# Patient Record
Sex: Female | Born: 1975 | Race: White | Hispanic: No | Marital: Married | Smoking: Never smoker
Health system: Southern US, Community
[De-identification: ages and names within clinical notes are randomized; demographics above are authoritative.]

## PROBLEM LIST (undated history)

## (undated) DIAGNOSIS — R7303 Prediabetes: Secondary | ICD-10-CM

## (undated) DIAGNOSIS — E282 Polycystic ovarian syndrome: Secondary | ICD-10-CM

## (undated) HISTORY — DX: Prediabetes: R73.03

## (undated) HISTORY — DX: Polycystic ovarian syndrome: E28.2

## (undated) HISTORY — PX: CERVICAL BIOPSY  W/ LOOP ELECTRODE EXCISION: SUR135

---

## 1998-05-17 ENCOUNTER — Other Ambulatory Visit: Admission: RE | Admit: 1998-05-17 | Discharge: 1998-05-17 | Payer: Self-pay | Admitting: Obstetrics and Gynecology

## 1998-06-28 ENCOUNTER — Other Ambulatory Visit: Admission: RE | Admit: 1998-06-28 | Discharge: 1998-06-28 | Payer: Self-pay | Admitting: Obstetrics and Gynecology

## 1999-04-17 ENCOUNTER — Ambulatory Visit (HOSPITAL_COMMUNITY): Admission: RE | Admit: 1999-04-17 | Discharge: 1999-04-17 | Payer: Self-pay | Admitting: Obstetrics and Gynecology

## 1999-04-17 ENCOUNTER — Encounter: Payer: Self-pay | Admitting: Obstetrics and Gynecology

## 1999-06-13 ENCOUNTER — Other Ambulatory Visit: Admission: RE | Admit: 1999-06-13 | Discharge: 1999-06-13 | Payer: Self-pay | Admitting: Gynecology

## 1999-08-28 ENCOUNTER — Encounter: Payer: Self-pay | Admitting: Gynecology

## 1999-08-28 ENCOUNTER — Ambulatory Visit (HOSPITAL_COMMUNITY): Admission: RE | Admit: 1999-08-28 | Discharge: 1999-08-28 | Payer: Self-pay | Admitting: Gynecology

## 1999-09-26 ENCOUNTER — Ambulatory Visit (HOSPITAL_COMMUNITY): Admission: RE | Admit: 1999-09-26 | Discharge: 1999-09-26 | Payer: Self-pay | Admitting: Gynecology

## 1999-09-26 ENCOUNTER — Encounter: Payer: Self-pay | Admitting: Gynecology

## 1999-10-08 ENCOUNTER — Encounter: Admission: RE | Admit: 1999-10-08 | Discharge: 2000-01-06 | Payer: Self-pay | Admitting: Gynecology

## 2000-01-16 ENCOUNTER — Encounter (INDEPENDENT_AMBULATORY_CARE_PROVIDER_SITE_OTHER): Payer: Self-pay

## 2000-01-16 ENCOUNTER — Inpatient Hospital Stay (HOSPITAL_COMMUNITY): Admission: AD | Admit: 2000-01-16 | Discharge: 2000-01-19 | Payer: Self-pay | Admitting: Gynecology

## 2000-03-02 ENCOUNTER — Other Ambulatory Visit: Admission: RE | Admit: 2000-03-02 | Discharge: 2000-03-02 | Payer: Self-pay | Admitting: Gynecology

## 2001-03-12 ENCOUNTER — Other Ambulatory Visit: Admission: RE | Admit: 2001-03-12 | Discharge: 2001-03-12 | Payer: Self-pay | Admitting: Gynecology

## 2001-12-08 ENCOUNTER — Other Ambulatory Visit: Admission: RE | Admit: 2001-12-08 | Discharge: 2001-12-08 | Payer: Self-pay | Admitting: Gynecology

## 2001-12-16 ENCOUNTER — Encounter: Admission: RE | Admit: 2001-12-16 | Discharge: 2002-03-16 | Payer: Self-pay | Admitting: Gynecology

## 2002-02-10 ENCOUNTER — Encounter: Admission: RE | Admit: 2002-02-10 | Discharge: 2002-05-11 | Payer: Self-pay | Admitting: *Deleted

## 2002-02-24 ENCOUNTER — Ambulatory Visit (HOSPITAL_COMMUNITY): Admission: RE | Admit: 2002-02-24 | Discharge: 2002-02-24 | Payer: Self-pay | Admitting: Gynecology

## 2002-02-24 ENCOUNTER — Encounter: Payer: Self-pay | Admitting: Gynecology

## 2002-03-01 ENCOUNTER — Other Ambulatory Visit: Admission: RE | Admit: 2002-03-01 | Discharge: 2002-03-01 | Payer: Self-pay | Admitting: Gynecology

## 2002-06-29 ENCOUNTER — Encounter: Payer: Self-pay | Admitting: Gynecology

## 2002-06-29 ENCOUNTER — Ambulatory Visit (HOSPITAL_COMMUNITY): Admission: RE | Admit: 2002-06-29 | Discharge: 2002-06-29 | Payer: Self-pay | Admitting: Gynecology

## 2002-06-30 ENCOUNTER — Inpatient Hospital Stay (HOSPITAL_COMMUNITY): Admission: AD | Admit: 2002-06-30 | Discharge: 2002-07-03 | Payer: Self-pay | Admitting: Gynecology

## 2002-08-09 ENCOUNTER — Other Ambulatory Visit: Admission: RE | Admit: 2002-08-09 | Discharge: 2002-08-09 | Payer: Self-pay | Admitting: Gynecology

## 2003-08-29 ENCOUNTER — Other Ambulatory Visit: Admission: RE | Admit: 2003-08-29 | Discharge: 2003-08-29 | Payer: Self-pay | Admitting: Gynecology

## 2004-09-23 ENCOUNTER — Other Ambulatory Visit: Admission: RE | Admit: 2004-09-23 | Discharge: 2004-09-23 | Payer: Self-pay | Admitting: Gynecology

## 2005-09-25 ENCOUNTER — Other Ambulatory Visit: Admission: RE | Admit: 2005-09-25 | Discharge: 2005-09-25 | Payer: Self-pay | Admitting: Gynecology

## 2006-10-01 ENCOUNTER — Other Ambulatory Visit: Admission: RE | Admit: 2006-10-01 | Discharge: 2006-10-01 | Payer: Self-pay | Admitting: Gynecology

## 2007-01-01 ENCOUNTER — Other Ambulatory Visit: Admission: RE | Admit: 2007-01-01 | Discharge: 2007-01-01 | Payer: Self-pay | Admitting: Gynecology

## 2007-02-12 ENCOUNTER — Ambulatory Visit (HOSPITAL_COMMUNITY): Admission: RE | Admit: 2007-02-12 | Discharge: 2007-02-12 | Payer: Self-pay | Admitting: Endocrinology

## 2007-11-10 ENCOUNTER — Other Ambulatory Visit: Admission: RE | Admit: 2007-11-10 | Discharge: 2007-11-10 | Payer: Self-pay | Admitting: Gynecology

## 2008-06-20 IMAGING — US US TRANSVAGINAL NON-OB
1 series · 13 of 25 positions shown · non-contrast
Comparison: none

02/15/07 – DUPLICATE COPY for exam association in [facility] – No change from original report.
CLINICAL DATA: Polycystic ovarian syndrome.  Evaluate for ovarian cyst.  
 TRANSABDOMINAL AND TRANSVAGINAL PELVIC ULTRASOUND:
TECHNIQUE: Both transabdominal and transvaginal ultrasound examinations of the pelvis were performed including evaluation of the uterus, ovaries, adnexal regions, and pelvic cul-de-sac.

[Series 1: us transvaginal non-ob · 13 of 38 slices shown]
[im 1/38]
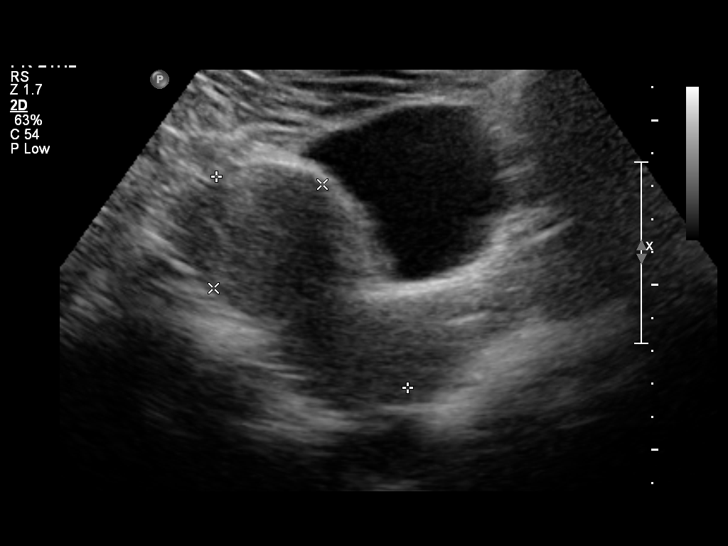
[im 4/38]
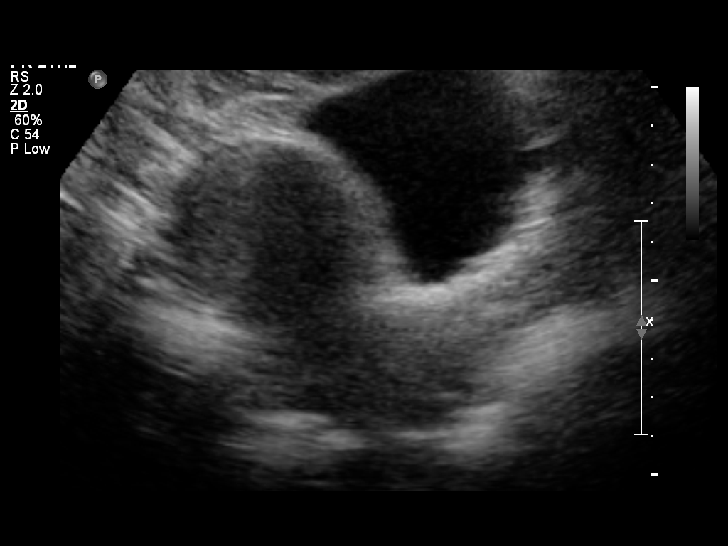
[im 7/38]
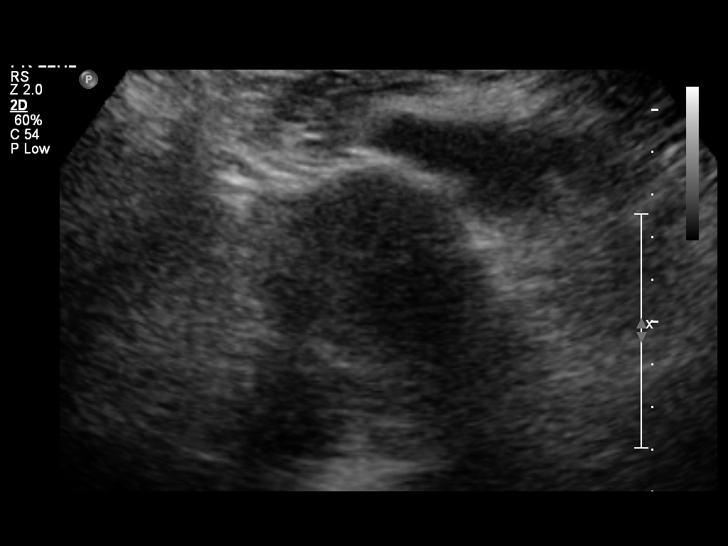
[im 10/38]
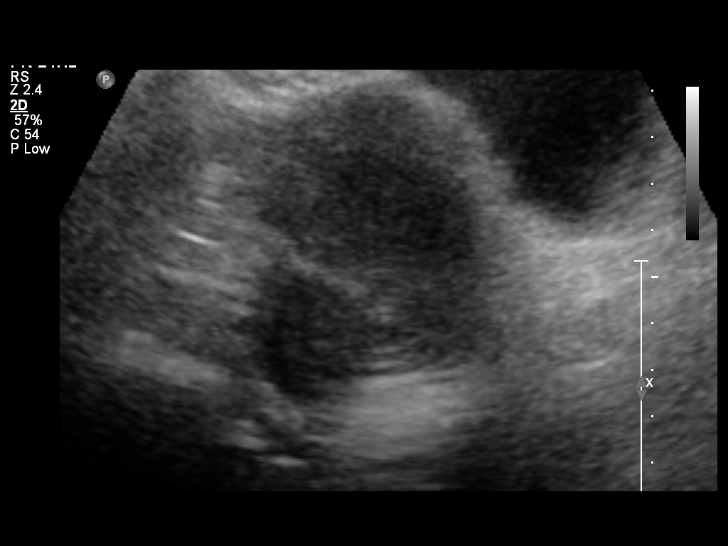
[im 13/38]
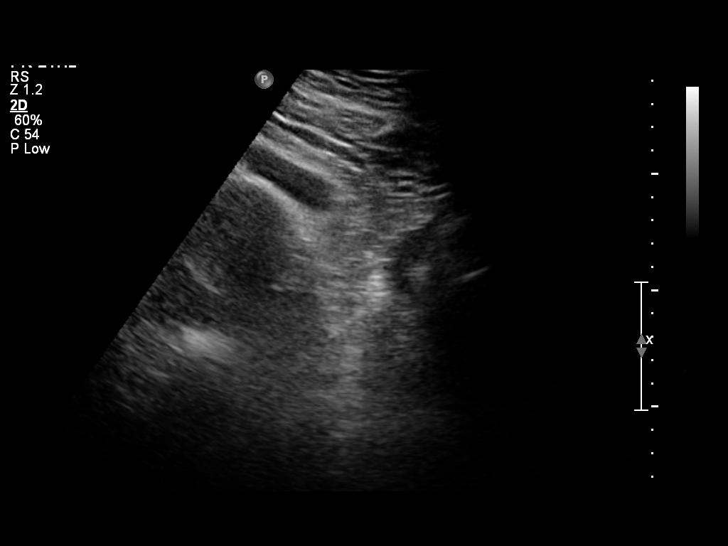
[im 16/38]
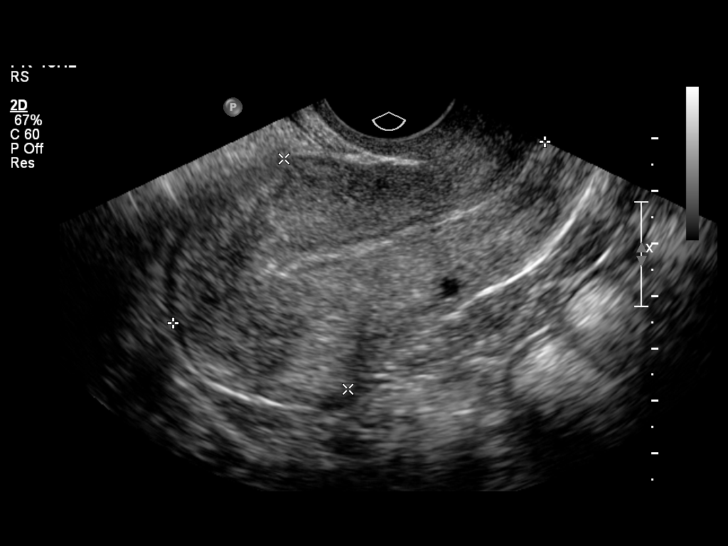
[im 19/38]
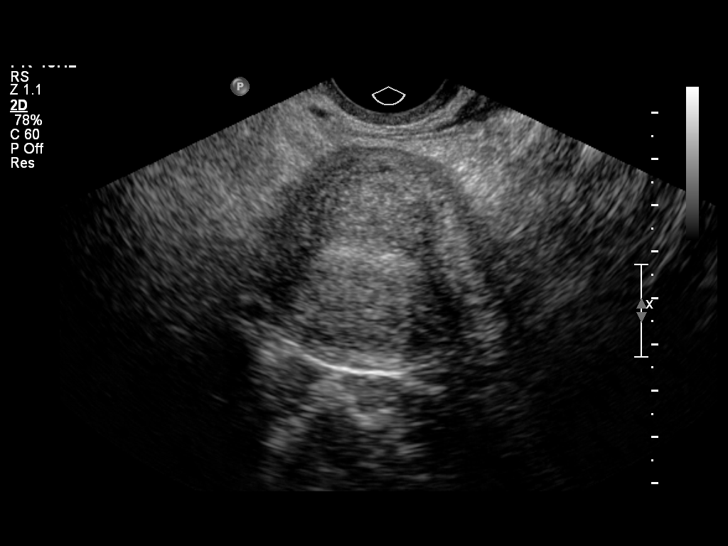
[im 22/38]
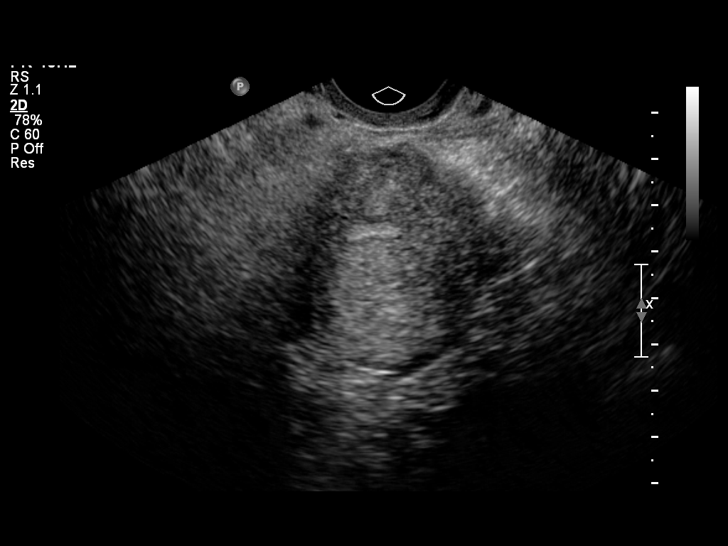
[im 25/38]
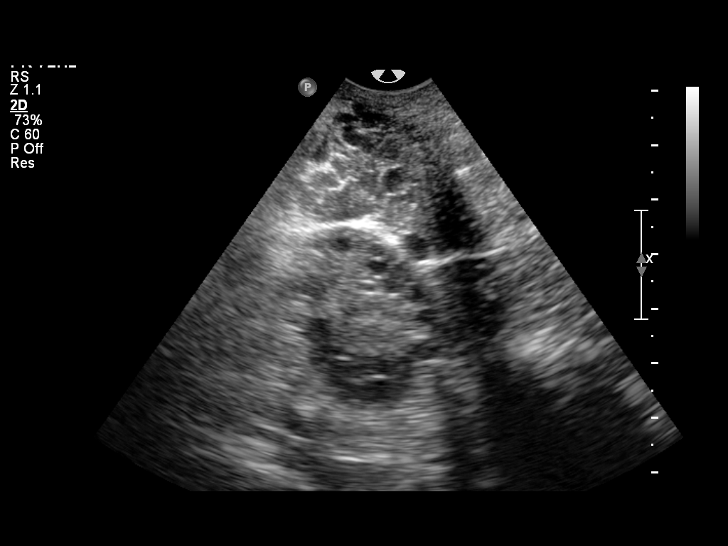
[im 28/38]
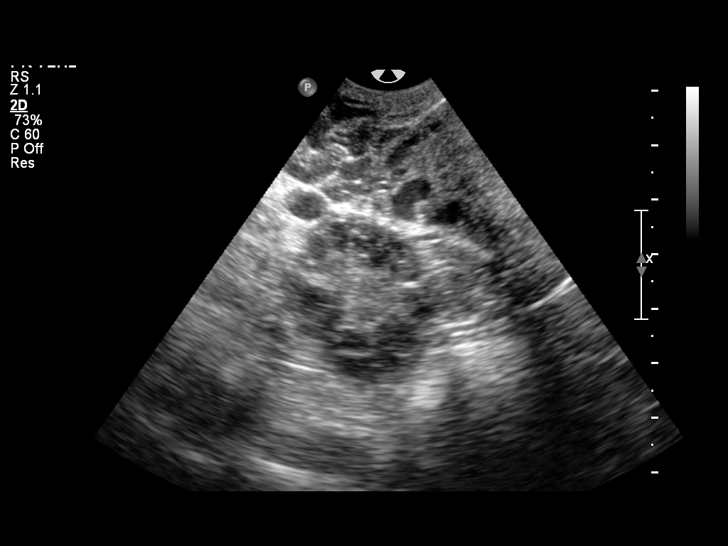
[im 31/38]
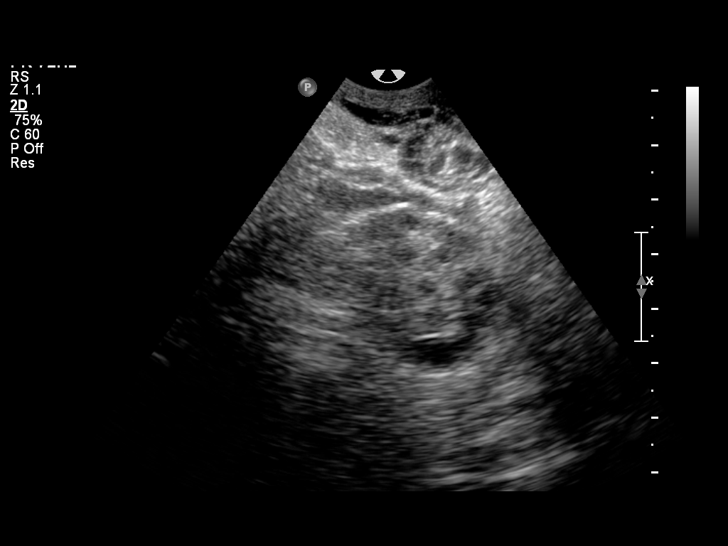
[im 34/38]
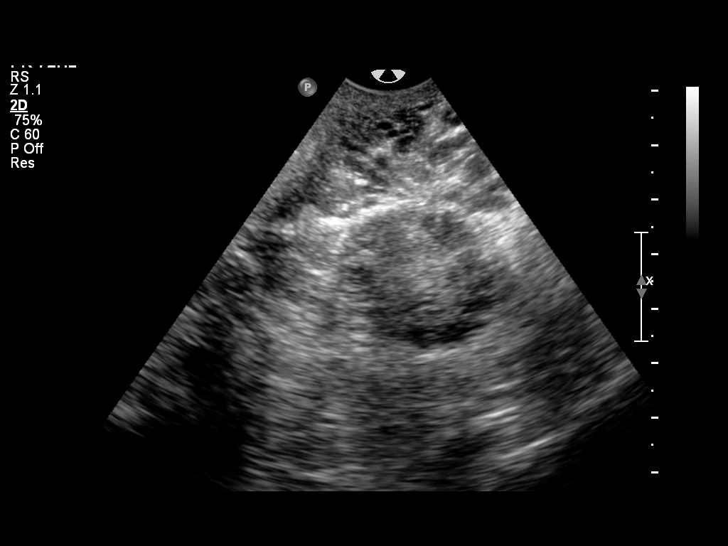
[im 38/38]
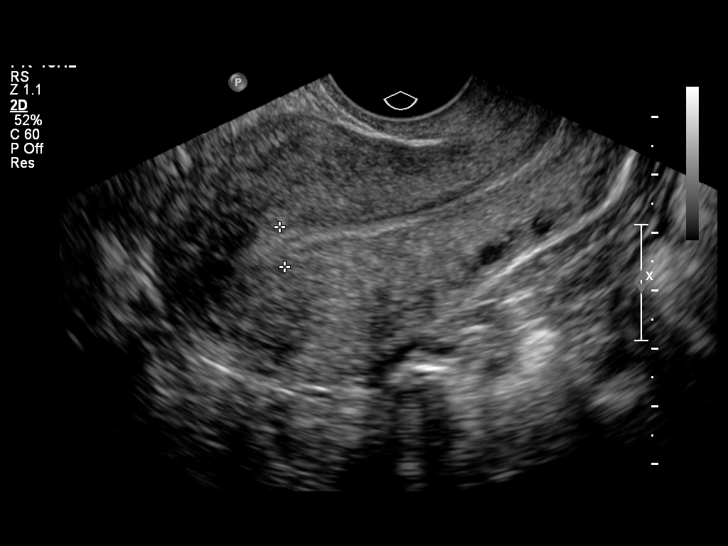

[13 of 25 positions shown; findings below may reference images not displayed]

FINDINGS: The uterus is normal in size and appearance.  No fibroids or other uterine abnormalities are seen.  Endometrial thickness measures approximately 7 mm in transvaginal sonography.  
 The right ovary measures 3.3 x 2.4 x 2.3 cm and the left ovary measures 2.9 x 2.3 x 2.6 cm.  Both ovaries have numerous tiny peripherally located follicles measuring less than 1 cm.  These findings are consistent with a diagnosis of polycystic ovarian syndrome.  No dominant ovarian follicles or cysts are noted.  No adnexal masses or free fluid are identified by transabdominal or transvaginal sonography.
IMPRESSION: 1.  Numerous, tiny less than 1 cm follicles in both ovaries, without evidence of a dominant follicle or cyst.  These findings are consistent with polycystic ovarian syndrome. 
 2.  Normal uterus.  
 3.  No evidence of pelvic or adnexal mass.

## 2008-11-14 ENCOUNTER — Ambulatory Visit: Payer: Self-pay | Admitting: Gynecology

## 2008-11-14 ENCOUNTER — Other Ambulatory Visit: Admission: RE | Admit: 2008-11-14 | Discharge: 2008-11-14 | Payer: Self-pay | Admitting: Gynecology

## 2008-11-14 ENCOUNTER — Encounter: Payer: Self-pay | Admitting: Gynecology

## 2009-06-12 ENCOUNTER — Ambulatory Visit: Payer: Self-pay | Admitting: Gynecology

## 2009-06-12 ENCOUNTER — Other Ambulatory Visit: Admission: RE | Admit: 2009-06-12 | Discharge: 2009-06-12 | Payer: Self-pay | Admitting: Gynecology

## 2010-03-08 ENCOUNTER — Ambulatory Visit: Payer: Self-pay | Admitting: Gynecology

## 2010-03-15 ENCOUNTER — Other Ambulatory Visit
Admission: RE | Admit: 2010-03-15 | Discharge: 2010-03-15 | Payer: Self-pay | Source: Home / Self Care | Admitting: Gynecology

## 2010-04-12 ENCOUNTER — Ambulatory Visit
Admission: RE | Admit: 2010-04-12 | Discharge: 2010-04-12 | Payer: Self-pay | Source: Home / Self Care | Attending: Gynecology | Admitting: Gynecology

## 2010-04-15 ENCOUNTER — Ambulatory Visit
Admission: RE | Admit: 2010-04-15 | Discharge: 2010-04-15 | Payer: Self-pay | Source: Home / Self Care | Attending: Gynecology | Admitting: Gynecology

## 2010-08-09 NOTE — H&P (Signed)
NAME:  Monica Richmond, Monica Richmond                         ACCOUNT NO.:  [id number]   MEDICAL RECORD NO.:  [id number]                   PATIENT TYPE:  INP   LOCATION:  9160                                 FACILITY:  [facility]   PHYSICIAN:  Ivor Costa. Farrel Gobble, M.D.              DATE OF BIRTH:  1975-12-27   DATE OF ADMISSION:  06/30/2002  DATE OF DISCHARGE:                                HISTORY & PHYSICAL   CHIEF COMPLAINT:  37 plus week pregnancy for induction.   HISTORY OF PRESENT ILLNESS:  The patient is a 35 year old Gravida II, Para I  with a last menstrual period of October 08, 2001. Estimated date of confinement  of July 17, 2002. Estimated gestational age of 22 and 3/7th weeks with  history of insulin requiring diabetes mellitus in this pregnancy and  estimated fetal weight ten days ago of 7 pounds and 2 ounces for elective  induction. Her pregnancy has been complicated by insulin requiring diabetes  mellitus. Her estimated fetal weight at 28 weeks was greater than the 97th  percentile. However, with improvement of glycemic control, her follow-up  estimated fetal weight have improved. At 32 weeks she was in the 90th to  95th percentile and at 35 and 1/2 weeks, she was now in the 89th percentile  at 7 pounds and 2 ounces. The patient strongly desired an attempt at vaginal  delivery. Therefore, underwent an amniocentesis earlier this week which was  mature LF TG and PG present. The patient denies any loss of fluid, vaginal  bleeding, or contractions. She reports good fetal movement and is without  any other complaint.   PAST OB/GYN HISTORY:  Significant for the previous as mentioned above.  Morbid obesity. She is 306 pounds with a weight gain of 7 pounds in the  pregnancy. History of LEAP at the age of 15 years.   LABORATORY DATA:  Prenatal labs show positive antibody negative, RPR non-  reactive, Rubella immune, Hepatitis B surface antigen non-reactive, HIV non-  reactive, AFP within normal  limits, GBS negative. Please refer to the  [facility] for complete history.   PHYSICAL EXAMINATION:  GENERAL: A well appearing Gravida in no acute  distress.  VITAL SIGNS: Weight 306 pounds. Blood pressure 124/80. Urine dip negative.  HEART: Regular rate and rhythm.  LUNGS: Clear to auscultation and percussion.  ABDOMEN: Gravid, soft, nontender. Fetal heart tones were not obtained as  this had been done earlier in the day. NST reactive. AFI normal.  GU: Vaginal examination she is short, closed, and minus three. Ultrasound  confirms vertex presentation.  EXTREMITIES: Trace edema.   ASSESSMENT:  Diabetes mellitus with improving control, mature tap, for  elective induction. The patient is aware that she may fail and require a  Cesarean section. She is aware of the risk of shoulder dystasia, soft tissue  dystasia and other such complications. She presents electively this evening  for induction.  Ivor Costa. Farrel Gobble, M.D.    THL/MEDQ  D:  06/30/2002  T:  07/01/2002  Job:  [id number]

## 2010-08-09 NOTE — Discharge Summary (Signed)
   NAME:  Monica Richmond, Monica Richmond                         ACCOUNT NO.:  [id number]   MEDICAL RECORD NO.:  [id number]                   PATIENT TYPE:  INP   LOCATION:  9107                                 FACILITY:  [facility]   PHYSICIAN:  Ivor Costa. Farrel Gobble, M.D.              DATE OF BIRTH:  1975/04/27   DATE OF ADMISSION:  06/30/2002  DATE OF DISCHARGE:  07/03/2002                                 DISCHARGE SUMMARY   DISCHARGE DIAGNOSES:  1. Intrauterine pregnancy 37 weeks, delivered.  2. Insulin-dependent gestational diabetes.  3. Status post spontaneous vaginal delivery.   HISTORY:  A 35 year old female gravida 2, para 1 with an EDC of July 17, 2002.  Prenatal course complicated by gestational diabetes which is insulin-  dependent.  Pregnancy had been complicated as well by estimated fetal weight  at 28 weeks at greater than 97th percentile.  However, with improvement of  glycemic control, her follow-up estimated fetal weight improved.  At 32 she  was in 90-95th percentile, 35-1/2 weeks she was 89th percentile.  The  patient strongly desired attempt at vaginal delivery.  Therefore, prior to  admission underwent an amniocentesis for lung maturity which revealed mature  LS PG was present and therefore patient was admitted for induction.   HOSPITAL COURSE:  On June 30, 2002 patient admitted at 37+ weeks.  Was given  Cervidil and on the a.m. of July 01, 2002 patient was begun on Pitocin.  Subsequently, on July 01, 2002 at 1619 patient underwent a spontaneous  vaginal delivery of a female, Apgars of 8 and 9, weight of 8 pounds 7  ounces.  There were no complications.  First and secondary laceration which  was repaired.  Postpartum patient remained afebrile, voiding, stable  condition.  She did have the postpartum blues on July 03, 2002 but stated  she had with her previous pregnancy and lasted only a few days.  The patient  declined treatment for depression.  The patient was felt satisfactory for  discharge and was discharged to home and given [facility]  postpartum instruction/postpartum booklet.   ACCESSORY CLINICAL FINDINGS:  Laboratories:  The patient is O+.  Rubella  immune.  On July 02, 2002 hemoglobin was 11.8.   DISPOSITION:  The patient is discharged to home.  Follow up in the office in  six weeks.  Any problem prior to that time to be seen in the office.     Susa Loffler, P.A.                    Ivor Costa. Farrel Gobble, M.D.    TSG/MEDQ  D:  08/05/2002  T:  08/05/2002  Job:  [id number]

## 2010-08-09 NOTE — Discharge Summary (Signed)
[facility]  Patient:    Monica Richmond, Monica Richmond                      MRN: [id number] Adm. Date:  [id number] Disc. Date: [id number] Attending:  Wetzel Bjornstad Dictator:   Antony Contras, [facility]                           Discharge Summary  DISCHARGE DIAGNOSES:          1. Intrauterine pregnancy at 40-3/7 weeks.                               2. History of gestational diabetes,                                  diet-controlled.  PROCEDURES:                   1. Normal spontaneous vaginal delivery of a                                  viable infant over intact perineum.                               2. Repair of first degree laceration.  HISTORY OF PRESENT ILLNESS:   The patient is a 35 year old prima gravida with an LMP of April 09, 1999, [facility] January 14, 2000.  The pregnancy was complicated by obesity, history of prior LEEP surgery, and gestational diabetes which was diet-controlled.  Laboratories are as follows:  Blood type O-positive, antibody screen negative; RPR, HBsAg, HIV nonreactive; Rubella immune, MSAFP normal, GBS negative.  HOSPITAL COURSE/TREATMENT:    The patient was admitted on January 17, 2000. The cervix was 1 cm, 70%, -3 station.  She was having contractions to 1-1/ minutes.  Artificial rupture of membranes revealed clear fluid.  She did progress to complete dilatation and delivered an Apgar 8 and 35 female infant, weighing 7 pounds 3 ounces over intact perineum with repair of first degree laceration.  She did have some elevated blood pressures after delivery and [facility] laboratories were obtained.  Postpartum course:  She remained afebrile.  No difficulty voiding.  Blood pressures did resolve.  She was able to be discharged on her second postpartum day in satisfactory condition.  LABORATORY DATA:              CBC:  Hematocrit of 33.6, hemoglobin 11.6, WBC 14.8, platelets 186.  DISPOSITION:                  Follow up in six weeks.  Continue  with prenatal vitamins and iron, Motrin and Tylox for pain. DD:  02/07/00 TD:  02/07/00 Job: [id number]

## 2011-03-02 ENCOUNTER — Other Ambulatory Visit: Payer: Self-pay | Admitting: Gynecology

## 2011-03-21 ENCOUNTER — Encounter: Payer: Self-pay | Admitting: Gynecology

## 2011-03-21 ENCOUNTER — Ambulatory Visit (INDEPENDENT_AMBULATORY_CARE_PROVIDER_SITE_OTHER): Payer: BC Managed Care – PPO | Admitting: Gynecology

## 2011-03-21 VITALS — BP 132/90 | Ht 67.0 in | Wt 288.0 lb

## 2011-03-21 DIAGNOSIS — Z01419 Encounter for gynecological examination (general) (routine) without abnormal findings: Secondary | ICD-10-CM

## 2011-03-21 DIAGNOSIS — Z3041 Encounter for surveillance of contraceptive pills: Secondary | ICD-10-CM

## 2011-03-21 MED ORDER — NORGESTIMATE-ETH ESTRADIOL 0.25-35 MG-MCG PO TABS: 1.0000 | ORAL_TABLET | Freq: Every day | ORAL | Status: DC

## 2011-03-21 NOTE — Patient Instructions (Signed)
Follow up and have your blood pressure rechecked in a non-exam situation. If your blood pressure remains elevated then you'll need to see Dr. Horald Pollen and we need to discuss birth control alternatives.

## 2011-03-21 NOTE — Progress Notes (Signed)
Monica Richmond [birth year] [id number]        35 y.o.  for annual exam.  On Sprintec. Being followed by Dr. Horald Pollen for metabolic syndrome.  Past medical history,surgical history, medications, allergies, family history and social history were all reviewed and documented in the EPIC chart. ROS:  Was performed and pertinent positives and negatives are included in the history.  Exam: chaperone present Filed Vitals:   03/21/11 1528  BP: 130/88   General appearance  Normal Skin grossly normal Head/Neck normal with no cervical or supraclavicular adenopathy thyroid normal Lungs  clear Cardiac RR, without RMG Abdominal  soft, nontender, without masses, organomegaly or hernia Breasts  examined lying and sitting without masses, retractions, discharge or axillary adenopathy. Pelvic  Ext/BUS/vagina  normal   Cervix  normal    Uterus  anteverted, normal size, shape and contour, midline and mobile nontender   Adnexa  Without masses or tenderness    Anus and perineum  normal   Rectovaginal  normal sphincter tone without palpated masses or tenderness.    Assessment/Plan:  35 y.o. female for annual exam.    1. Elevated blood pressure. Patient's blood pressures mildly elevated and had a repeat again is still up. I reviewed this with her she has no history of this previously and notes that she had a blood pressure in the 110-120/70 range when she saw Dr. Horald Pollen recently. I asked her to have her blood pressure rechecked in a non-exam situation. If it does remain elevated then she may need to have this addressed by Dr. Horald Pollen also. See #2. 2. Birth control. She is on Sprintec doing well wants to continue. I reviewed the risks to include stroke, heart attack, DVT. She does not smoke and reports her glucose cholesterol values are normal when she sees Dr. Horald Pollen. I discussed her elevated blood pressure. I reviewed alternatives for contraception to include Mirena IUD. I recommended she seriously consider this. At  this point I refilled her Sprintec she understands I want her to make sure that she follows up her blood pressure and that if it does remain elevated that we need to rediscuss the birth control pills and alternatives for contraception. 3. Pap smear. She has no history of abnormal Pap smears other than ascus in 2008. Her annual Pap smears since then have all been normal the last one in 2011. I discussed current screening guidelines with less frequent intervals and she agrees with this I did not do a Pap smear today we'll plan on every three-year screening. 4. Mammogram. I reviewed screening mammography between 35 and 40. She has a strong family history and prefers to wait closer to 40. 5. Health maintenance. No blood work was done today as this is all done through Dr. Janus Molder office.    Dara Lords MD, 4:28 PM 03/21/2011

## 2012-04-15 ENCOUNTER — Ambulatory Visit (INDEPENDENT_AMBULATORY_CARE_PROVIDER_SITE_OTHER): Payer: BC Managed Care – PPO | Admitting: Gynecology

## 2012-04-15 ENCOUNTER — Encounter: Payer: Self-pay | Admitting: Gynecology

## 2012-04-15 VITALS — BP 130/80 | Ht 67.0 in | Wt 269.0 lb

## 2012-04-15 DIAGNOSIS — R21 Rash and other nonspecific skin eruption: Secondary | ICD-10-CM

## 2012-04-15 DIAGNOSIS — Z13220 Encounter for screening for lipoid disorders: Secondary | ICD-10-CM

## 2012-04-15 DIAGNOSIS — Z01419 Encounter for gynecological examination (general) (routine) without abnormal findings: Secondary | ICD-10-CM

## 2012-04-15 DIAGNOSIS — N879 Dysplasia of cervix uteri, unspecified: Secondary | ICD-10-CM | POA: Insufficient documentation

## 2012-04-15 DIAGNOSIS — R635 Abnormal weight gain: Secondary | ICD-10-CM

## 2012-04-15 LAB — CBC WITH DIFFERENTIAL/PLATELET
Basophils Relative: 0 % (ref 0–1)
Eosinophils Absolute: 0.1 10*3/uL (ref 0.0–0.7)
Eosinophils Relative: 1 % (ref 0–5)
Hemoglobin: 13 g/dL (ref 12.0–15.0)
Lymphs Abs: 2.4 10*3/uL (ref 0.7–4.0)
MCH: 26.7 pg (ref 26.0–34.0)
MCHC: 32.9 g/dL (ref 30.0–36.0)
MCV: 81.3 fL (ref 78.0–100.0)
Monocytes Absolute: 0.9 10*3/uL (ref 0.1–1.0)
Monocytes Relative: 10 % (ref 3–12)
Neutrophils Relative %: 63 % (ref 43–77)
RBC: 4.86 MIL/uL (ref 3.87–5.11)

## 2012-04-15 LAB — COMPREHENSIVE METABOLIC PANEL
Alkaline Phosphatase: 105 U/L (ref 39–117)
BUN: 7 mg/dL (ref 6–23)
CO2: 30 mEq/L (ref 19–32)
Creat: 0.65 mg/dL (ref 0.50–1.10)
Glucose, Bld: 80 mg/dL (ref 70–99)
Sodium: 138 mEq/L (ref 135–145)
Total Bilirubin: 0.4 mg/dL (ref 0.3–1.2)
Total Protein: 6.9 g/dL (ref 6.0–8.3)

## 2012-04-15 LAB — LIPID PANEL
Cholesterol: 145 mg/dL (ref 0–200)
HDL: 48 mg/dL (ref >39)
LDL Cholesterol: 45 mg/dL (ref 0–99)
Total CHOL/HDL Ratio: 3 Ratio
Triglycerides: 261 mg/dL — ABNORMAL HIGH (ref <150)
VLDL: 52 mg/dL — ABNORMAL HIGH (ref 0–40)

## 2012-04-15 MED ORDER — NYSTATIN-TRIAMCINOLONE 100000-0.1 UNIT/GM-% EX OINT: TOPICAL_OINTMENT | Freq: Two times a day (BID) | CUTANEOUS | Status: DC

## 2012-04-15 NOTE — Progress Notes (Signed)
Monica Richmond [birth year] [id number]        37 y.o.  [id number] for annual exam.  Several issues noted below.  Past medical history,surgical history, medications, allergies, family history and social history were all reviewed and documented in the EPIC chart. ROS:  Was performed and pertinent positives and negatives are included in the history.  Exam: Kim assistant Filed Vitals:   04/15/12 1536  BP: 130/80  Height: 5\' 7"  (1.702 m)  Weight: 269 lb (122.018 kg)   General appearance  Normal Skin grossly normal Head/Neck normal with no cervical or supraclavicular adenopathy thyroid normal Lungs  clear Cardiac RR, without RMG Abdominal  soft, nontender, without masses, organomegaly or hernia. Slight rash in the pannicular fold consistent with fungal Breasts  examined lying and sitting without masses, retractions, discharge or axillary adenopathy. Pelvic  Ext/BUS/vagina  normal   Cervix  normal   Uterus  anteverted, normal size, shape and contour, midline and mobile nontender   Adnexa  Without masses or tenderness    Anus and perineum  normal   Rectovaginal  normal sphincter tone without palpated masses or tenderness.    Assessment/Plan:  37 y.o. G67P2002 female for annual exam, regular menses, barrier contraception.   1. Contraception. Patient stopped birth control pills this past year it has regular monthly menses. Using barrier and rhythm method. I reviewed failure risks with this and alternatives to include Mirena IUD. I strongly urged her to consider this. She does not want do anything at this point and understands and accepts the risks pregnancy. 2. Breast health. SBE monthly reviewed. Screening mammographic recommendations between 35 and 40 discussed.  Patient has no strong family history and prefers to wait closer to 40. 3. Pap smear 2011.  No Pap smear done today. History of LEEP age 53.  Normal Pap smears since. Plan repeat Pap smear next year at 3 year interval. 4. PCOS. Sees Dr.  Horald Pollen for her glucose control currently on metformin and spironolactone. Regular menses off oral contraceptives. Blood pressure acceptable at 130/80.  Continue to monitor. 5. Health maintenance. Baseline CBC comprehensive metabolic panel lipid profile urinalysis TSH ordered. Follow up one year, sooner for contraceptive discussion if she chooses.   Dara Lords MD, 4:17 PM 04/15/2012

## 2012-04-15 NOTE — Patient Instructions (Signed)
Follow up in one year for annual exam. Follow up sooner if you want to consider Mirena IUD or other contraceptive options.

## 2012-04-16 ENCOUNTER — Encounter: Payer: Self-pay | Admitting: Gynecology

## 2012-04-16 ENCOUNTER — Other Ambulatory Visit: Payer: Self-pay | Admitting: Gynecology

## 2012-04-16 DIAGNOSIS — E7800 Pure hypercholesterolemia, unspecified: Secondary | ICD-10-CM

## 2012-04-16 LAB — URINALYSIS W MICROSCOPIC + REFLEX CULTURE
Bilirubin Urine: NEGATIVE
Crystals: NONE SEEN
Glucose, UA: NEGATIVE mg/dL
Protein, ur: NEGATIVE mg/dL
Specific Gravity, Urine: 1.007 (ref 1.005–1.030)
Squamous Epithelial / LPF: NONE SEEN
Urobilinogen, UA: 0.2 mg/dL (ref 0.0–1.0)

## 2012-04-17 LAB — URINE CULTURE: Colony Count: 3000

## 2012-11-23 ENCOUNTER — Ambulatory Visit (INDEPENDENT_AMBULATORY_CARE_PROVIDER_SITE_OTHER): Payer: BC Managed Care – PPO | Admitting: Gynecology

## 2012-11-23 ENCOUNTER — Encounter: Payer: Self-pay | Admitting: Gynecology

## 2012-11-23 DIAGNOSIS — M549 Dorsalgia, unspecified: Secondary | ICD-10-CM

## 2012-11-23 DIAGNOSIS — N949 Unspecified condition associated with female genital organs and menstrual cycle: Secondary | ICD-10-CM

## 2012-11-23 DIAGNOSIS — R102 Pelvic and perineal pain: Secondary | ICD-10-CM

## 2012-11-23 LAB — CBC WITH DIFFERENTIAL/PLATELET
HCT: 39.4 % (ref 36.0–46.0)
Hemoglobin: 13.3 g/dL (ref 12.0–15.0)
Lymphocytes Relative: 24 % (ref 12–46)
Lymphs Abs: 2.1 10*3/uL (ref 0.7–4.0)
Monocytes Absolute: 0.6 10*3/uL (ref 0.1–1.0)
Monocytes Relative: 7 % (ref 3–12)
Neutro Abs: 6 10*3/uL (ref 1.7–7.7)
WBC: 8.9 10*3/uL (ref 4.0–10.5)

## 2012-11-23 LAB — URINALYSIS W MICROSCOPIC + REFLEX CULTURE
Crystals: NONE SEEN
Ketones, ur: NEGATIVE mg/dL
Nitrite: NEGATIVE
Specific Gravity, Urine: <1.005 — ABNORMAL LOW (ref 1.005–1.030)
Urobilinogen, UA: 0.2 mg/dL (ref 0.0–1.0)

## 2012-11-23 MED ORDER — IBUPROFEN 800 MG PO TABS: 800.0000 mg | ORAL_TABLET | Freq: Three times a day (TID) | ORAL | Status: DC | PRN

## 2012-11-23 NOTE — Patient Instructions (Signed)
Follow up for ultrasound as scheduled 

## 2012-11-23 NOTE — Progress Notes (Signed)
Patient presents with the acute onset of right lower quadrant pelvic pain this morning. She started her menses 2 days ago and awoke with this pain this morning. She took 3 over the counter Aleve and notes that the pain is easing off and now she feels more pressure in her pelvis. Menses have been monthly although seem to becoming a little earlier each month. No intermenstrual bleeding. No diarrhea constipation nausea or vomiting. No fever chills. Using withdrawal  Exam with Berenice Bouton Spine straight without CVA tenderness Abdomen soft mild tenderness in the right lower quadrant. No rebound guarding masses or organomegaly. Pelvic external BUS vagina with slight menses flow. Cervix normal. Uterus grossly normal size, mobile nontender. Adnexa without masses mild tenderness on the right.  Assessment and plan: Suspect ovarian cyst rupture. Patient feeling better now than at the onset. Check baseline CBC,hCG, urinalysis and ultrasound.

## 2012-11-25 ENCOUNTER — Other Ambulatory Visit: Payer: Self-pay | Admitting: *Deleted

## 2012-11-25 DIAGNOSIS — R319 Hematuria, unspecified: Secondary | ICD-10-CM

## 2012-11-25 LAB — URINE CULTURE

## 2012-11-26 ENCOUNTER — Encounter: Payer: Self-pay | Admitting: Gynecology

## 2012-11-26 ENCOUNTER — Other Ambulatory Visit: Payer: BC Managed Care – PPO

## 2012-11-26 ENCOUNTER — Ambulatory Visit (INDEPENDENT_AMBULATORY_CARE_PROVIDER_SITE_OTHER): Payer: BC Managed Care – PPO | Admitting: Gynecology

## 2012-11-26 ENCOUNTER — Ambulatory Visit (INDEPENDENT_AMBULATORY_CARE_PROVIDER_SITE_OTHER): Payer: BC Managed Care – PPO

## 2012-11-26 ENCOUNTER — Other Ambulatory Visit: Payer: BC Managed Care – PPO | Admitting: Gynecology

## 2012-11-26 DIAGNOSIS — M549 Dorsalgia, unspecified: Secondary | ICD-10-CM

## 2012-11-26 DIAGNOSIS — R1031 Right lower quadrant pain: Secondary | ICD-10-CM

## 2012-11-26 DIAGNOSIS — IMO0002 Reserved for concepts with insufficient information to code with codable children: Secondary | ICD-10-CM

## 2012-11-26 DIAGNOSIS — N946 Dysmenorrhea, unspecified: Secondary | ICD-10-CM

## 2012-11-26 DIAGNOSIS — N949 Unspecified condition associated with female genital organs and menstrual cycle: Secondary | ICD-10-CM

## 2012-11-26 DIAGNOSIS — R319 Hematuria, unspecified: Secondary | ICD-10-CM

## 2012-11-26 DIAGNOSIS — R102 Pelvic and perineal pain: Secondary | ICD-10-CM

## 2012-11-26 NOTE — Progress Notes (Signed)
Patient follows up for ultrasound due to history of pelvic pain suspicious for ruptured ovarian cyst. Pain has pretty much resolved although she does note a lot of discomfort with the ultrasound.  Ultrasound shows uterus normal size and echotexture. Endometrial echo 5.6 mm. Right left ear was visualized and normal. Cul-de-sac negative.  Assessment and plan: Probable ruptured ovarian cyst accounting for her pain now resolved. She does note monthly feeling ovulation at times. Options for BCP suppression discussed. Patient declines at this time. She is due for her annual exam after the first of the year she would prefer just to monitor in the interim. Patient did have a few RBCs with her urinalysis previously in I rechecked a clean-catch UA today and we'll followup with those results.

## 2012-11-26 NOTE — Patient Instructions (Signed)
Followup if pain persists. Otherwise for annual exam after the first of the year

## 2012-11-27 LAB — URINALYSIS W MICROSCOPIC + REFLEX CULTURE
Bilirubin Urine: NEGATIVE
Crystals: NONE SEEN
Glucose, UA: NEGATIVE mg/dL
Specific Gravity, Urine: 1.005 (ref 1.005–1.030)
Squamous Epithelial / LPF: NONE SEEN

## 2013-04-25 ENCOUNTER — Encounter: Payer: Self-pay | Admitting: Gynecology

## 2013-04-25 ENCOUNTER — Ambulatory Visit (INDEPENDENT_AMBULATORY_CARE_PROVIDER_SITE_OTHER): Payer: BC Managed Care – PPO | Admitting: Gynecology

## 2013-04-25 VITALS — BP 124/74 | HR 78 | Resp 14 | Ht 67.0 in | Wt 297.0 lb

## 2013-04-25 DIAGNOSIS — Z0000 Encounter for general adult medical examination without abnormal findings: Secondary | ICD-10-CM

## 2013-04-25 DIAGNOSIS — Z124 Encounter for screening for malignant neoplasm of cervix: Secondary | ICD-10-CM

## 2013-04-25 DIAGNOSIS — B372 Candidiasis of skin and nail: Secondary | ICD-10-CM

## 2013-04-25 DIAGNOSIS — E282 Polycystic ovarian syndrome: Secondary | ICD-10-CM

## 2013-04-25 DIAGNOSIS — Z01419 Encounter for gynecological examination (general) (routine) without abnormal findings: Secondary | ICD-10-CM

## 2013-04-25 LAB — POCT URINALYSIS DIPSTICK
UROBILINOGEN UA: NEGATIVE
pH, UA: 5

## 2013-04-25 MED ORDER — NYSTATIN-TRIAMCINOLONE 100000-0.1 UNIT/GM-% EX OINT: 1.0000 "application " | TOPICAL_OINTMENT | Freq: Two times a day (BID) | CUTANEOUS | Status: DC

## 2013-04-25 NOTE — Progress Notes (Signed)
38 y.o. Married Caucasian female   G2P2002 here for annual exam. Pt is currently sexually active.  Pt is taking metformin for cycle regulation due to PCOS, cycles have been regular since except this month.  LNMP 04/04/13 but stated bleeding again 1/31.  Flow for 3-5d.  Pt has never used contraception but has not conceived since daughter 10y ago.  Pt is ok if she should get pregnant.    Patient's last menstrual period was 04/23/2013.          Sexually active: yes  The current method of family planning is none.    Exercising: yes  walk atleast 3x/wk Last pap: 03/15/10 Negative  Alcohol: No Tobacco: no BSE: yes  Hgb: 12.7 ; Urine: Leuks 1; Trace Protein    Health Maintenance  Topic Date Due  . Pap Smear  09/09/1992  . Tetanus/tdap  09/09/1993  . Influenza Vaccine  10/22/2012    History reviewed. No pertinent family history.  Patient Active Problem List   Diagnosis Date Noted  . Diabetes mellitus     Past Medical History  Diagnosis Date  . Diabetes mellitus     GESTATIONAL   . PCOS (polycystic ovarian syndrome)     Past Surgical History  Procedure Laterality Date  . Cervical biopsy  w/ loop electrode excision  age 58    Allergies: Review of patient's allergies indicates no known allergies.  Current Outpatient Prescriptions  Medication Sig Dispense Refill  . ibuprofen (ADVIL,MOTRIN) 800 MG tablet Take 1 tablet (800 mg total) by mouth every 8 (eight) hours as needed for pain.  30 tablet  1  . MetFORMIN HCl (GLUCOPHAGE PO) Take 500 mg by mouth.       . Spironolactone (ALDACTONE PO) Take 50 mg by mouth.        No current facility-administered medications for this visit.    ROS: Pertinent items are noted in HPI.  Exam:    BP 124/74  Pulse 78  Resp 14  Ht 5\' 7"  (1.702 m)  Wt 297 lb (134.718 kg)  BMI 46.51 kg/m2  LMP 04/23/2013 Weight change: @WEIGHTCHANGE @ Last 3 height recordings:  Ht Readings from Last 3 Encounters:  04/25/13 5\' 7"  (1.702 m)  04/15/12 5\' 7"   (1.702 m)  03/21/11 5\' 7"  (1.702 m)   General appearance: alert, cooperative and appears stated age Head: Normocephalic, without obvious abnormality, atraumatic Neck: no adenopathy, no carotid bruit, no JVD, supple, symmetrical, trachea midline and thyroid not enlarged, symmetric, no tenderness/mass/nodules Lungs: clear to auscultation bilaterally Breasts: normal appearance, no masses or tenderness Heart: regular rate and rhythm, S1, S2 normal, no murmur, click, rub or gallop Abdomen: soft, non-tender; bowel sounds normal; no masses,  no organomegaly Extremities: extremities normal, atraumatic, no cyanosis or edema Skin: Skin color, texture, turgor normal. No rashes or lesions Lymph nodes: Cervical, supraclavicular, and axillary nodes normal. no inguinal nodes palpated Neurologic: Grossly normal   Pelvic: External genitalia:  no lesions              Urethra: normal appearing urethra with no masses, tenderness or lesions              Bartholins and Skenes: normal                 Vagina: normal appearing vagina with normal color and discharge, no lesions              Cervix: normal appearance  Pap taken: yes        Bimanual Exam:  Uterus:  uterus is normal size, shape, consistency and nontender, limited by habitus                                      Adnexa:    no masses                                      Rectovaginal: Confirms                                      Anus:  normal sphincter tone, no lesions  A: well woman Contraceptive management     P: mammogram at 39 pap smear with HrHPV Ok if conceives counseled on breast self exam, mammography screening, adequate intake of calcium and vitamin D, diet and exercise return annually or prn   An After Visit Summary was printed and given to the patient.

## 2013-04-26 LAB — HEMOGLOBIN, FINGERSTICK: HEMOGLOBIN, FINGERSTICK: 12.7 g/dL (ref 12.0–16.0)

## 2013-04-28 LAB — IPS PAP TEST WITH HPV

## 2013-09-27 ENCOUNTER — Other Ambulatory Visit: Payer: Self-pay | Admitting: Gynecology

## 2013-09-27 NOTE — Telephone Encounter (Signed)
Last AEX and refill 04/25/13 #30g/ 0 Refills  Please approve or deny Rx.

## 2014-01-05 ENCOUNTER — Telehealth: Payer: Self-pay | Admitting: Gynecology

## 2014-01-05 NOTE — Telephone Encounter (Signed)
Spoke with patient. She states she is having emotional concerns that she feels she needs to discuss with Dr. Farrel GobbleLathrop. Family stressors to include issues with marriage and mother in law with illness. Patient states "I don't feel like myself". This has been ongoing for a few months per patient. Patient has family counselor that she uses and family counselor has suggested office visit for blood work for hormone evaluation.  Patient denies thoughts of self harm or harm to others. Patient scheduled for first available appointment with with Dr. Farrel GobbleLathrop 01/06/14 at 1330 and she is agreeable. She is advised to call back if any symptoms change. Patient agreeable.  Routing to provider for final review. Patient agreeable to disposition. Will close encounter

## 2014-01-05 NOTE — Telephone Encounter (Signed)
Pt says she is not feeling too good and would like to see Dr Farrel GobbleLathrop.

## 2014-01-05 NOTE — Telephone Encounter (Signed)
Pt says that she is emotionally a mess right now and wants to have an appt to talk with dr lathrop

## 2014-01-06 ENCOUNTER — Ambulatory Visit (INDEPENDENT_AMBULATORY_CARE_PROVIDER_SITE_OTHER): Payer: BC Managed Care – PPO | Admitting: Gynecology

## 2014-01-06 VITALS — BP 126/74 | Resp 18 | Ht 67.0 in | Wt 294.0 lb

## 2014-01-06 DIAGNOSIS — F329 Major depressive disorder, single episode, unspecified: Secondary | ICD-10-CM

## 2014-01-06 DIAGNOSIS — F4321 Adjustment disorder with depressed mood: Secondary | ICD-10-CM

## 2014-01-06 MED ORDER — SERTRALINE HCL 50 MG PO TABS: 50.0000 mg | ORAL_TABLET | Freq: Every day | ORAL | Status: DC

## 2014-01-06 NOTE — Progress Notes (Signed)
Pt here reporting emotional stress. Pt has been married for 17y and has been with her husband for over 21y.  Pt is closer to his mother than her own.  Pt states that her mother-in-law was diagnosed recently with metastatic colon cancer and has been in and out of hospice for pain control.  She is not expected to live through the year. This has been very difficult for both she and her husband, however since her diagnosis, her husband has shut her out.  They do not communicate at all now.  Pt feels guilty because she reached out to an old friend who also is female and has only been texting for support, pt has not seem him.  She is tearful and feels like she is grieving her mother-in-law alone.  Pt states that she went to see a counselor last week.  She and her husband have not been intimate in years, initially felt to be due to his ED due to low testosterone but now it is felt to be psychological.   Offered support. Pt interested in trying couples counseling Referrals given, pt will make appt Can also see her clergy  Pt agreeable rx for zoloft 50mg   3320m spent counseling >50% face to face

## 2014-01-23 ENCOUNTER — Encounter: Payer: Self-pay | Admitting: Gynecology

## 2014-02-20 ENCOUNTER — Telehealth: Payer: Self-pay | Admitting: Gynecology

## 2014-02-20 NOTE — Telephone Encounter (Signed)
Left message regarding cancelled aex appt. °

## 2014-02-23 ENCOUNTER — Telehealth: Payer: Self-pay | Admitting: Gynecology

## 2014-02-23 NOTE — Telephone Encounter (Signed)
Spoke with patient. Patient states that her mother in law is dying and it is putting a lot of stress on her. "The medication has really been helping but the situation has gotten worse and I feel like it could be just a little bit better." Patient started Zoloft in 12/2014. Currently taking 50 mg daily. Desires increase in rx strength. Advised would send a message over to covering MD and return call with further recommendations and instructions. Patient is agreeable.

## 2014-02-23 NOTE — Telephone Encounter (Signed)
Patient calling requesting an increase on her Zoloft.  [id number] [address]- [place], [place] - [phone]

## 2014-02-23 NOTE — Telephone Encounter (Signed)
Ok to increase to Zoloft 100 mg po q day.  I recommend counseling for the patient if she is not already seeing a therapist. This could be through hospice/cancer center for example.

## 2014-02-24 MED ORDER — SERTRALINE HCL 100 MG PO TABS: 100.0000 mg | ORAL_TABLET | Freq: Every day | ORAL | Status: DC

## 2014-02-24 NOTE — Telephone Encounter (Signed)
Spoke with patient. Advised patient of message as seen below from Dr.Silva. Patient is agreeable and verbalizes understanding. Prescription for Zoloft 100 mg #90 0RF until next aex sent to pharmacy on file. Patient is agreeable and verbalizes understanding. Patient is currently seeing a counselor once per week.  Routing to provider for final review. Patient agreeable to disposition. Will close encounter

## 2014-04-28 ENCOUNTER — Ambulatory Visit (INDEPENDENT_AMBULATORY_CARE_PROVIDER_SITE_OTHER): Payer: BLUE CROSS/BLUE SHIELD | Admitting: Certified Nurse Midwife

## 2014-04-28 ENCOUNTER — Encounter: Payer: Self-pay | Admitting: Certified Nurse Midwife

## 2014-04-28 ENCOUNTER — Ambulatory Visit: Payer: BC Managed Care – PPO | Admitting: Gynecology

## 2014-04-28 VITALS — BP 110/72 | HR 70 | Resp 16 | Ht 67.25 in | Wt 296.0 lb

## 2014-04-28 DIAGNOSIS — Z01419 Encounter for gynecological examination (general) (routine) without abnormal findings: Secondary | ICD-10-CM

## 2014-04-28 NOTE — Progress Notes (Signed)
39 y.o. G26P2002 Married  Caucasian Fe here for annual exam. Periods normal, no issues. Sees Dr. Talmage Nap for PCOS management of glucose and Aldactone medication. Stable per patient. Patient and spouse in counseling due to stress with mother-in-laws death. Working on exercise. Patient requests if possible since she is stable, can we manage PCOS, due to cost of care with Dr. Talmage Nap. If not she will manage. No other health issues today.  Patient's last menstrual period was 04/08/2014.          Sexually active: Yes.    The current method of family planning is none.    Exercising: Yes.    walking Smoker:  no  Health Maintenance: Pap:  04-25-13 neg HPV HR neg MMG:  none Colonoscopy:  none BMD:   none TDaP:  unsure Labs: done occ   reports that she has never smoked. She has never used smokeless tobacco. She reports that she does not drink alcohol or use illicit drugs.  Past Medical History  Diagnosis Date  . Diabetes mellitus     GESTATIONAL   . PCOS (polycystic ovarian syndrome)     Past Surgical History  Procedure Laterality Date  . Cervical biopsy  w/ loop electrode excision  age 27    Current Outpatient Prescriptions  Medication Sig Dispense Refill  . fluticasone (FLONASE) 50 MCG/ACT nasal spray   0  . ibuprofen (ADVIL,MOTRIN) 800 MG tablet Take 1 tablet (800 mg total) by mouth every 8 (eight) hours as needed for pain. 30 tablet 1  . MetFORMIN HCl (GLUCOPHAGE PO) Take 500 mg by mouth.     . sertraline (ZOLOFT) 100 MG tablet Take 1 tablet (100 mg total) by mouth daily. 90 tablet 0  . Spironolactone (ALDACTONE PO) Take 50 mg by mouth.      No current facility-administered medications for this visit.    History reviewed. No pertinent family history.  ROS:  Pertinent items are noted in HPI.  Otherwise, a comprehensive ROS was negative.  Exam:   BP 110/72 mmHg  Pulse 70  Resp 16  Ht 5' 7.25" (1.708 m)  Wt 296 lb (134.265 kg)  BMI 46.02 kg/m2  LMP 04/08/2014 Height: 5' 7.25"  (170.8 cm) Ht Readings from Last 3 Encounters:  04/28/14 5' 7.25" (1.708 m)  01/06/14  (1.702 m)  04/25/13  (1.702 m)    General appearance: alert, cooperative and appears stated age Head: Normocephalic, without obvious abnormality, atraumatic Neck: no adenopathy, supple, symmetrical, trachea midline and thyroid normal to inspection and palpation Lungs: clear to auscultation bilaterally Breasts: normal appearance, no masses or tenderness, No nipple retraction or dimpling, No nipple discharge or bleeding, No axillary or supraclavicular adenopathy Heart: regular rate and rhythm Abdomen: soft, non-tender; no masses,  no organomegaly Extremities: extremities normal, atraumatic, no cyanosis or edema Skin: Skin color, texture, turgor normal. No rashes or lesions Lymph nodes: Cervical, supraclavicular, and axillary nodes normal. No abnormal inguinal nodes palpated Neurologic: Grossly normal   Pelvic: External genitalia:  no lesions              Urethra:  normal appearing urethra with no masses, tenderness or lesions              Bartholin's and Skene's: normal                 Vagina: normal appearing vagina with normal color and discharge, no lesions              Cervix: normal, non  tender, no lesions              Pap taken: No. Bimanual Exam:  Uterus:  normal size, contour, position, consistency, mobility, non-tender              Adnexa: normal adnexa and no mass, fullness, tenderness               Rectovaginal: Confirms               Anus:  normal sphincter tone, no lesions  Chaperone present: Yes  A:  Well Woman with normal exam  Contraception NFP monitors cycle  PCOS with stable medication with Dr. Talmage NapBalan would like us to manage  P:   Reviewed health and wellness pertinent to exam  Encouraged to monitor ovulation  Continue follow up until review records from her office to see if possibility due to cost and agreeable with MD to manage.  Pap smear not taken  today   Mammogram yearly starting at 40 and SBE monthly counseled on breast self exam, mammography screening, adequate intake of calcium and vitamin D, diet and exercise  return annually or prn  An After Visit Summary was printed and given to the patient.

## 2014-04-28 NOTE — Patient Instructions (Signed)

## 2014-05-01 NOTE — Progress Notes (Signed)
Reviewed personally.  M. Suzanne Wm Sahagun, MD.  

## 2014-05-03 ENCOUNTER — Telehealth: Payer: Self-pay | Admitting: Certified Nurse Midwife

## 2014-05-03 NOTE — Telephone Encounter (Signed)
I agree with your recommendations. OK to wait until Friday to see Debbie, patient's primary provider.

## 2014-05-03 NOTE — Telephone Encounter (Signed)
Pt says she feel like something is falling out of her private area.

## 2014-05-03 NOTE — Telephone Encounter (Signed)
Spoke with patient. She was here for annual exam with Verner Choleborah S. Leonard CNM on 04/28/14. States that she was feeling well and started her cycle on Saturday morning. Sunday she was walking with her husband and states that she felt "razor blades" in vaginal area. She had her husband look at area and noted protrusion out of vaginal area. States that area if pink and healthy looking. Denies difficulty voiding or having bowel movement, no fevers or vaginal discharge. Patient does not use tampons.  Patient states she is having intermittent feelings of cramps in lower back. States she is "uncomfortable" but that it is "tolerable."   Patient declines appointment tomorrow with Verner Choleborah S. Leonard CNM due to work schedule.  Office visit with Verner Choleborah S. Leonard CNM scheduled for Friday 05/05/14.  Patient advised to call back with any increase in symptoms or difficulty in voiding or bowel movement. Patient verbalized understanding.  Advised patient would review with Dr. Edward JollySilva (covering physician) and will return call with any new instructions.    Routing to Dr. Edward JollySilva to review and Verner Choleborah S. Leonard CNM

## 2014-05-05 ENCOUNTER — Ambulatory Visit (INDEPENDENT_AMBULATORY_CARE_PROVIDER_SITE_OTHER): Payer: BLUE CROSS/BLUE SHIELD | Admitting: Certified Nurse Midwife

## 2014-05-05 ENCOUNTER — Encounter: Payer: Self-pay | Admitting: Certified Nurse Midwife

## 2014-05-05 VITALS — BP 120/72 | HR 64 | Temp 98.0°F | Ht 67.25 in | Wt 295.0 lb

## 2014-05-05 DIAGNOSIS — IMO0002 Reserved for concepts with insufficient information to code with codable children: Secondary | ICD-10-CM

## 2014-05-05 DIAGNOSIS — R350 Frequency of micturition: Secondary | ICD-10-CM

## 2014-05-05 DIAGNOSIS — N8110 Cystocele, unspecified: Secondary | ICD-10-CM

## 2014-05-05 DIAGNOSIS — N816 Rectocele: Secondary | ICD-10-CM

## 2014-05-05 LAB — POCT URINALYSIS DIPSTICK
Bilirubin, UA: NEGATIVE
Blood, UA: NEGATIVE
Glucose, UA: NEGATIVE
Ketones, UA: NEGATIVE
Nitrite, UA: NEGATIVE
PROTEIN UA: NEGATIVE
Urobilinogen, UA: NEGATIVE
pH, UA: 7

## 2014-05-05 NOTE — Progress Notes (Signed)
39 y.o.married white female g2p2002 here with complaint of vaginal protrusion and slight urinary frequency. Patient lifts heavy items, more than 75 pounds at work. Patient was out shopping and squatted down for shoes and noticed pelvic pressure with some discomfort. No bowel leakage or urinary leakage. Patient has been on weight loss also with exercising and walking long distances. Recent aex here with no problems noted. No bleeding or pain with sexual activity.. No other health issues. O:Healthy female WDWN Affect: normal, orientation x 3  Exam: Abdomen:soft, non tender,  Lymph node: no enlargement or tenderness Inguinal area no hernia noted Pelvic exam: External genital: normal female, no lesions BUS: negative Vagina: Had patient cough and bear down and noted rectocele grade 1-2 with mild cystocele  Noted, normal discharge noted. Patient viewed area in mirror and said this is what she felt Cervix: normal, non tender Uterus: normal, non tender Adnexa:normal, non tender, no masses or fullness noted   A:Rectocele grade 1-2 with mild cystocele R/O UTI Normal pelvic exam   P:Discussed findings of of rectocele and cystocele and etiology. Reviewed pictures of and pelvic support exercises with kegels. Patient has good kegel contraction and watched visually how pelvic support improved with use. [employer] brochure on. Discussed no heavy lifting now and stop squatting as exercise, avoid prolonged standing and this will help with pressure she is feeling. Try to avoid constipation which will increase pressure in rectal area. Patient  Voiced understanding and relieved she did not have uterine prolapse. Questions addressed.  Lab Urine culture  RV prn change  15 min in time spent with patient in discussion of pelvic support problem.

## 2014-05-05 NOTE — Progress Notes (Signed)
Reviewed personally.  M. Suzanne Caedan Sumler, MD.  

## 2014-05-05 NOTE — Patient Instructions (Signed)

## 2014-05-07 LAB — URINE CULTURE: Colony Count: >=100000

## 2014-05-08 ENCOUNTER — Telehealth: Payer: Self-pay

## 2014-05-08 NOTE — Telephone Encounter (Signed)
lmtcb

## 2014-05-08 NOTE — Telephone Encounter (Signed)
Patient notified of results. See lab 

## 2014-05-08 NOTE — Telephone Encounter (Signed)
-----   Message from Verner Choleborah S Leonard, CNM sent at 05/08/2014  8:24 AM EST ----- Notify patient that culture showed no specific bacteria to treat just multiple bacteria, probably from vagina contamination.  No treatment indicated at this time.

## 2014-05-08 NOTE — Telephone Encounter (Signed)
Returning call.

## 2014-05-24 ENCOUNTER — Telehealth: Payer: Self-pay

## 2014-05-24 NOTE — Telephone Encounter (Signed)
yes

## 2014-05-24 NOTE — Telephone Encounter (Signed)
Spoke with patient. Advised patient Monica CholDeborah S. Leonard CNM [facility] and feels she needs to continue under her care. Patient is agreeable and verbalizes understanding. Signed records to scan.  Monica Choleborah S. Leonard CNM agree with plan?

## 2014-05-24 NOTE — Telephone Encounter (Signed)
Left message to call [name] at [phone].  Need to advise patient Monica Richmond CNM [facility] and feels she needs to continue under her care.

## 2014-06-01 ENCOUNTER — Other Ambulatory Visit: Payer: Self-pay | Admitting: *Deleted

## 2014-06-01 MED ORDER — SERTRALINE HCL 100 MG PO TABS: 100.0000 mg | ORAL_TABLET | Freq: Every day | ORAL | Status: DC

## 2014-06-01 NOTE — Telephone Encounter (Signed)
Medication refill request: Zoloft 100 mg Last AEX:  04/28/14 DL Next AEX: 2/9/562/6/17 DL Last MMG (if hormonal medication request): None Refill authorized: 02/24/14 #90/0R. Today #90/3R?

## 2014-07-05 ENCOUNTER — Telehealth: Payer: Self-pay | Admitting: Certified Nurse Midwife

## 2014-07-05 NOTE — Telephone Encounter (Signed)
Left message regarding upcoming appointment has been canceled and needs to be rescheduled. °

## 2014-11-08 ENCOUNTER — Other Ambulatory Visit: Payer: Self-pay

## 2014-11-08 DIAGNOSIS — Z1231 Encounter for screening mammogram for malignant neoplasm of breast: Secondary | ICD-10-CM

## 2014-11-21 ENCOUNTER — Ambulatory Visit
Admission: RE | Admit: 2014-11-21 | Discharge: 2014-11-21 | Disposition: A | Discharge status: Home / Self Care | Payer: BLUE CROSS/BLUE SHIELD | Source: Ambulatory Visit

## 2014-11-21 DIAGNOSIS — Z1231 Encounter for screening mammogram for malignant neoplasm of breast: Secondary | ICD-10-CM

## 2014-12-05 ENCOUNTER — Telehealth: Payer: Self-pay | Admitting: Obstetrics and Gynecology

## 2014-12-05 NOTE — Telephone Encounter (Signed)
Spoke with patient. Advised of mammogram results as seen below from 11/21/2014 mammogram. Patient is agreeable and verbalizes understanding.  Result Notes     Notes Recorded by Verner Chol, CNM on 11/22/2014 at 3:02 PM Mammogram reviewed Negative birad 1 Density B Repeat one year   Routing to provider for final review. Patient agreeable to disposition. Will close encounter.

## 2014-12-05 NOTE — Telephone Encounter (Signed)
Patient calling for recent MMG results.

## 2014-12-05 NOTE — Telephone Encounter (Signed)
Attempted to reach patient at work number provided [phone] line is busy. Left message for patient to return call to office on her cell phone number provided [phone].

## 2015-04-30 ENCOUNTER — Ambulatory Visit: Payer: BLUE CROSS/BLUE SHIELD | Admitting: Certified Nurse Midwife

## 2015-05-02 ENCOUNTER — Ambulatory Visit (INDEPENDENT_AMBULATORY_CARE_PROVIDER_SITE_OTHER): Payer: 59 | Admitting: Obstetrics and Gynecology

## 2015-05-02 ENCOUNTER — Encounter: Payer: Self-pay | Admitting: Obstetrics and Gynecology

## 2015-05-02 VITALS — BP 132/84 | HR 84 | Resp 16 | Ht 67.25 in | Wt 310.0 lb

## 2015-05-02 DIAGNOSIS — N816 Rectocele: Secondary | ICD-10-CM

## 2015-05-02 DIAGNOSIS — Z01419 Encounter for gynecological examination (general) (routine) without abnormal findings: Secondary | ICD-10-CM

## 2015-05-02 DIAGNOSIS — E282 Polycystic ovarian syndrome: Secondary | ICD-10-CM | POA: Diagnosis not present

## 2015-05-02 NOTE — Patient Instructions (Addendum)
EXERCISE AND DIET:  We recommended that you start or continue a regular exercise program for good health. Regular exercise means any activity that makes your heart beat faster and makes you sweat.  We recommend exercising at least 30 minutes per day at least 3 days a week, preferably 4 or 5.  We also recommend a diet low in fat and sugar.  Inactivity, poor dietary choices and obesity can cause diabetes, heart attack, stroke, and kidney damage, among others.    ALCOHOL AND SMOKING:  Women should limit their alcohol intake to no more than 7 drinks/beers/glasses of wine (combined, not each!) per week. Moderation of alcohol intake to this level decreases your risk of breast cancer and liver damage. And of course, no recreational drugs are part of a healthy lifestyle.  And absolutely no smoking or even second hand smoke. Most people know smoking can cause heart and lung diseases, but did you know it also contributes to weakening of your bones? Aging of your skin?  Yellowing of your teeth and nails?  CALCIUM AND VITAMIN D:  Adequate intake of calcium and Vitamin D are recommended.  The recommendations for exact amounts of these supplements seem to change often, but generally speaking 600 mg of calcium (either carbonate or citrate) and 800 units of Vitamin D per day seems prudent. Certain women may benefit from higher intake of Vitamin D.  If you are among these women, your doctor will have told you during your visit.    PAP SMEARS:  Pap smears, to check for cervical cancer or precancers,  have traditionally been done yearly, although recent scientific advances have shown that most women can have pap smears less often.  However, every woman still should have a physical exam from her gynecologist every year. It will include a breast check, inspection of the vulva and vagina to check for abnormal growths or skin changes, a visual exam of the cervix, and then an exam to evaluate the size and shape of the uterus and  ovaries.  And after 40 years of age, a rectal exam is indicated to check for rectal cancers. We will also provide age appropriate advice regarding health maintenance, like when you should have certain vaccines, screening for sexually transmitted diseases, bone density testing, colonoscopy, mammograms, etc.   MAMMOGRAMS:  All women over 40 years old should have a yearly mammogram. Many facilities now offer a "3D" mammogram, which may cost around $50 extra out of pocket. If possible,  we recommend you accept the option to have the 3D mammogram performed.  It both reduces the number of women who will be called back for extra views which then turn out to be normal, and it is better than the routine mammogram at detecting truly abnormal areas.    COLONOSCOPY:  Colonoscopy to screen for colon cancer is recommended for all women at age 50.  We know, you hate the idea of the prep.  We agree, BUT, having colon cancer and not knowing it is worse!!  Colon cancer so often starts as a polyp that can be seen and removed at colonscopy, which can quite literally save your life!  And if your first colonoscopy is normal and you have no family history of colon cancer, most women don't have to have it again for 10 years.  Once every ten years, you can do something that may end up saving your life, right?  We will be happy to help you get it scheduled when you are ready.    Be sure to check your insurance coverage so you understand how much it will cost.  It may be covered as a preventative service at no cost, but you should check your particular policy.     About Rectocele  Overview  A rectocele is a type of hernia which causes different degrees of bulging of the rectal tissues into the vaginal wall.  You may even notice that it presses against the vaginal wall so much that some vaginal tissues droop outside of the opening of your vagina.  Causes of Rectocele  The most common cause is childbirth.  The muscles and ligaments  in the pelvis that hold up and support the female organs and vagina become stretched and weakened during labor and delivery.  The more babies you have, the more the support tissues are stretched and weakened.  Not everyone who has a baby will develop a rectocele.  Some women have stronger supporting tissue in the pelvis and may not have as much of a problem as others.  Women who have a Cesarean section usually do not get rectocele's unless they pushed a long time prior to the cesarean delivery.  Other conditions that can cause a rectocele include chronic constipation, a chronic cough, a lot of heavy lifting, and obesity.  Older women may have this problem because the loss of female hormones causes the vaginal tissue to become weaker.  Symptoms  There may not be any symptoms.  If you do have symptoms, they may include:  Pelvic pressure in the rectal area  Protrusion of the lower part of the vagina through the opening of the vagina  Constipation and trapping of the stool, making it difficult to have a bowel movement.  In severe cases, you may have to press on the lower part of your vagina to help push the stool out of you rectum.  This is called splinting to empty.  Diagnosing Rectocele  Your health care provider will ask about your symptoms and perform a pelvic exam.  S/he will ask you to bear down, pushing like you are having a bowel movement so as to see how far the lower part of the vagina protrudes into the vagina and possible outside of the vagina.  Your provider will also ask you to contract the muscles of your pelvis (like you are stopping the stream in the middle of urinating) to determine the strength of your pelvic muscles.  Your provider may also do a rectal exam.  Treatment Options  If you do not have any symptoms, no treatment may be necessary.  Other treatment options include:  Pelvic floor exercises: Contracting the muscles in your genital area may help strengthen your muscles and  support the organs.  Be sure to get proper exercise instruction from you physical therapist.  A pessary (removealbe pelvic support device) sometimes helps rectocele symptoms.  Surgery: Surgical repair may be necessary. In some cases the uterus may need to be taken out ( a hysterectomy) as well.  There are many types of surgery for pelvic support problems.  Look for physicians who specialize in repair procedures.  You can take care of yourself by:  Treating and preventing constipation  Avoiding heavy lifting, and lifting correctly (with your legs, not with you waist or back)  Treating a chronic cough or bronchitis  Not smoking  avoiding too much weight gain  Doing pelvic floor exercises   2007, [employer] Doc.33Kegel Exercises The goal of Kegel exercises is to isolate and exercise your  pelvic floor muscles. These muscles act as a hammock that supports the rectum, vagina, small intestine, and uterus. As the muscles weaken, the hammock sags and these organs are displaced from their normal positions. Kegel exercises can strengthen your pelvic floor muscles and help you to improve bladder and bowel control, improve sexual response, and help reduce many problems and some discomfort during pregnancy. Kegel exercises can be done anywhere and at any time. HOW TO PERFORM KEGEL EXERCISES 4. Locate your pelvic floor muscles. To do this, squeeze (contract) the muscles that you use when you try to stop the flow of urine. You will feel a tightness in the vaginal area (women) and a tight lift in the rectal area (men and women). 5. When you begin, contract your pelvic muscles tight for 2-5 seconds, then relax them for 2-5 seconds. This is one set. Do 4-5 sets with a short pause in between. 6. Contract your pelvic muscles for 8-10 seconds, then relax them for 8-10 seconds. Do 4-5 sets. If you cannot contract your pelvic muscles for 8-10 seconds, try 5-7 seconds and work your way up to 8-10  seconds. Your goal is 4-5 sets of 10 contractions each day. Keep your stomach, buttocks, and legs relaxed during the exercises. Perform sets of both short and long contractions. Vary your positions. Perform these contractions 3-4 times per day. Perform sets while you are:   Lying in bed in the morning.  Standing at lunch.  Sitting in the late afternoon.  Lying in bed at night. You should do 40-50 contractions per day. Do not perform more Kegel exercises per day than recommended. Overexercising can cause muscle fatigue. Continue these exercises for for at least 15-20 weeks or as directed by your caregiver.   This information is not intended to replace advice given to you by your health care provider. Make sure you discuss any questions you have with your health care provider.   Document Released: 02/25/2012 Document Revised: 03/31/2014 Document Reviewed: 02/25/2012 Else[employer] Interactive Patient Education [employer]

## 2015-05-02 NOTE — Progress Notes (Signed)
Patient ID: Monica Richmond, female   DOB: 1975/08/01, 40 y.o.   MRN: [id number] 40 y.o. [id number] MarriedCaucasianF here for annual exam.  She has a h/o PCOS and borderline diabetes, on metformin and spironolactone. See's Dr Talmage Nap. Labs are up to date and good with Dr Talmage Nap.  Period Cycle (Days): 28 Period Duration (Days): 4 days  Period Pattern: Regular Menstrual Flow: Moderate Menstrual Control: Maxi pad Dysmenorrhea: None  Saturates a pad in 3-5 hours. No BTB.  Sexually active no pain. No contraception, okay if she gets pregnant. On multivitamin with folic acid. She took clomid for her son, took 3 years to conceive, second child she got pregnant quickly on her on. She has a h/o a mild rectocele and cystocele, she notices more bulging prior to her cycles. Not constant. No trouble emptying her bowels. She sometimes needs to push on her vagina with BM.   Patient's last menstrual period was 03/30/2015.          Sexually active: Yes.    The current method of family planning is none.    Exercising: Yes.    walking Smoker:  no  Health Maintenance: Pap:  04-25-13 WNl NEG HR HPV History of abnormal Pap:  She had a leep at 16 normal paps since then.  MMG:  11-22-14 WNL Colonoscopy:  Never BMD:   Never TDaP:  Unsure, thinks UTD Gardasil: N/A   reports that she has never smoked. She has never used smokeless tobacco. She reports that she does not drink alcohol or use illicit drugs.Son is 67 and daughter is 100. She works at a [employer].   Past Medical History  Diagnosis Date  . Diabetes mellitus     GESTATIONAL   . PCOS (polycystic ovarian syndrome)     Past Surgical History  Procedure Laterality Date  . Cervical biopsy  w/ loop electrode excision  age 79    Current Outpatient Prescriptions  Medication Sig Dispense Refill  . fluticasone (FLONASE) 50 MCG/ACT nasal spray   0  . ibuprofen (ADVIL,MOTRIN) 800 MG tablet Take 1 tablet (800 mg total) by mouth every 8 (eight) hours as needed for  pain. 30 tablet 1  . MetFORMIN HCl (GLUCOPHAGE PO) Take 500 mg by mouth.     . spironolactone (ALDACTONE) 50 MG tablet Take 50 mg by mouth daily.   1   No current facility-administered medications for this visit.    History reviewed. No pertinent family history.  Review of Systems  Constitutional: Negative.   HENT: Negative.   Eyes: Negative.   Respiratory: Negative.   Cardiovascular: Negative.   Gastrointestinal: Negative.   Endocrine: Negative.   Genitourinary: Negative.   Musculoskeletal: Negative.   Skin: Negative.   Allergic/Immunologic: Negative.   Neurological: Negative.   Psychiatric/Behavioral: Negative.     Exam:   BP 132/84 mmHg  Pulse 84  Resp 16  Ht 5' 7.25" (1.708 m)  Wt 310 lb (140.615 kg)  BMI 48.20 kg/m2  LMP 03/30/2015  Weight change: @ Height:   Height: 5' 7.25" (170.8 cm)  Ht Readings from Last 3 Encounters:  05/02/15 5' 7.25" (1.708 m)  05/05/14 5' 7.25" (1.708 m)  04/28/14 5' 7.25" (1.708 m)    General appearance: alert, cooperative and appears stated age Head: Normocephalic, without obvious abnormality, atraumatic Neck: no adenopathy, supple, symmetrical, trachea midline and thyroid normal to inspection and palpation Lungs: clear to auscultation bilaterally Breasts: normal appearance, no masses or tenderness Heart: regular rate and rhythm Abdomen: soft, non-tender;  bowel sounds normal; no masses,  no organomegaly Extremities: extremities normal, atraumatic, no cyanosis or edema Skin: Skin color, texture, turgor normal. No rashes or lesions Lymph nodes: Cervical, supraclavicular, and axillary nodes normal. No abnormal inguinal nodes palpated Neurologic: Grossly normal   Pelvic: External genitalia:  no lesions               Urethra:  normal appearing urethra with no masses, tenderness or lesions              Bartholins and Skenes: normal                 Vagina: normal appearing vagina with normal color and discharge, no  lesions. Grade 2 rectocele with valsalva. No significant uterine prolapse or cystocele. Examined supine and standing.               Cervix: no lesions               Bimanual Exam:  Uterus:  normal size, contour, position, consistency, mobility, non-tender              Adnexa: no mass, fullness, tenderness               Rectovaginal: Confirms               Anus:  normal sphincter tone, no lesions  Chaperone was present for exam.  A:  Well Woman with normal exam  Genital prolapse, mostly tolerable  Not on contraception, on PNV  P:   No pap this year  Avoid heavy lifting and straining  Discussed the option of trying a pessary or surgery if symptoms are significant  Information on prolapse given  Mammogram next summer  Discussed calcium and vit D

## 2015-08-27 ENCOUNTER — Ambulatory Visit (INDEPENDENT_AMBULATORY_CARE_PROVIDER_SITE_OTHER): Payer: 59 | Admitting: Nurse Practitioner

## 2015-08-27 ENCOUNTER — Telehealth: Payer: Self-pay | Admitting: Nurse Practitioner

## 2015-08-27 ENCOUNTER — Encounter: Payer: Self-pay | Admitting: Nurse Practitioner

## 2015-08-27 VITALS — BP 124/82 | HR 104 | Temp 99.0°F | Resp 16 | Wt 307.4 lb

## 2015-08-27 DIAGNOSIS — R300 Dysuria: Secondary | ICD-10-CM | POA: Diagnosis not present

## 2015-08-27 DIAGNOSIS — N760 Acute vaginitis: Secondary | ICD-10-CM | POA: Diagnosis not present

## 2015-08-27 LAB — POCT URINALYSIS DIPSTICK
BILIRUBIN UA: NEGATIVE
Blood, UA: NEGATIVE
GLUCOSE UA: NEGATIVE
KETONES UA: NEGATIVE
Leukocytes, UA: NEGATIVE
Nitrite, UA: NEGATIVE
Protein, UA: NEGATIVE
Urobilinogen, UA: NEGATIVE
pH, UA: 5

## 2015-08-27 MED ORDER — NYSTATIN 100000 UNIT/GM EX CREA: 1.0000 "application " | TOPICAL_CREAM | Freq: Two times a day (BID) | CUTANEOUS | Status: DC

## 2015-08-27 MED ORDER — NITROFURANTOIN MONOHYD MACRO 100 MG PO CAPS: 100.0000 mg | ORAL_CAPSULE | Freq: Two times a day (BID) | ORAL | Status: DC

## 2015-08-27 MED ORDER — NYSTATIN-TRIAMCINOLONE 100000-0.1 UNIT/GM-% EX OINT: 1.0000 "application " | TOPICAL_OINTMENT | Freq: Two times a day (BID) | CUTANEOUS | Status: DC

## 2015-08-27 MED ORDER — FLUCONAZOLE 150 MG PO TABS: 150.0000 mg | ORAL_TABLET | Freq: Once | ORAL | Status: DC

## 2015-08-27 MED ORDER — TRIAMCINOLONE ACETONIDE 0.1 % EX CREA: 1.0000 "application " | TOPICAL_CREAM | Freq: Two times a day (BID) | CUTANEOUS | Status: DC

## 2015-08-27 MED ORDER — PHENAZOPYRIDINE HCL 200 MG PO TABS: 200.0000 mg | ORAL_TABLET | Freq: Three times a day (TID) | ORAL | Status: DC | PRN

## 2015-08-27 NOTE — Patient Instructions (Signed)

## 2015-08-27 NOTE — Telephone Encounter (Signed)
Patient calling stating her prescription she got today that needs compounded is too expensive. She said, "Patty said she may just need to write it for the two different medication instead. "   Pharmacy on file is correct.

## 2015-08-27 NOTE — Progress Notes (Signed)
39 y.o.Married Caucasian female G2P2002 here with complaint of UTI, with onset  on 08/24/15.  Sudden onset during SA. Patient complaining of:  dysuria and urinary urgency. Patient denies fever, chills, nausea or back pain. No new personal products. Patient feels might be related to sexual activity. Denies vaginal symptoms other than external vulva.    Contraception is withdrawal with LMP 08/15/15.  No change in partner.   Not menopausal with vaginal dryness. Patient has adequate water intake.  States the vulvar areas hurt with voiding and concerned about yeast infection as well.    O: Healthy female WDWN Affect: Normal, orientation x 3, Temp 99, anxious Skin : warm and dry CVAT: negative bilateral Abdomen: negative for suprapubic tenderness  Pelvic exam: External genital area: with multiple areas of redness and irritation around the clitoris Bladder,Urethra tender, Urethral meatus: normal Vagina:thin clear vaginal discharge, normal appearance Cervix: normal, non tender Adnexa: normal non tender, no fullness or masses  POCT:  negative  A:  R/O UTI  R/O vaginitis  P: Reviewed findings of UTI and need for treatment. Rx:  Macrobid 100 mg BID  Pyridium 200 mg TID prn  Diflucan 150 mg X 2   Triamcinolone to vulva areas BID prn   YQM:VHQIOLab:Urine micro, culture, Affirm Reviewed warning signs and symptoms of UTI and need to advise if occurring. Encouraged to limit soda, tea, and coffee   RV prn

## 2015-08-27 NOTE — Telephone Encounter (Signed)
Attempted to reach patient at work number provided [phone], no answer and no availability to leave voicemail. Left message for patient to return [facility] phone provided [phone].  Need to advise new rx for Nystatin cream and Triamcinolone cream have been sent to pharmacy on file. She will need to mix these two creams in a 1:1 ratio and apply bid as directed.

## 2015-08-27 NOTE — Telephone Encounter (Signed)
Spoke with patient. Advised new rx for Nystatin cream and Triamcinolone cream have been sent to pharmacy. She verbalizes understanding of how to mix prescriptions in a 1:1 ratio and apply bid.  Routing to provider for final review. Patient agreeable to disposition. Will close encounter.

## 2015-08-28 LAB — WET PREP BY MOLECULAR PROBE
CANDIDA SPECIES: POSITIVE — AB
GARDNERELLA VAGINALIS: NEGATIVE
TRICHOMONAS VAG: NEGATIVE

## 2015-08-28 LAB — URINALYSIS, MICROSCOPIC ONLY
Bacteria, UA: NONE SEEN [HPF]
CASTS: NONE SEEN [LPF]
Crystals: NONE SEEN [HPF]
RBC / HPF: NONE SEEN RBC/HPF (ref <=2)
Squamous Epithelial / LPF: NONE SEEN [HPF] (ref <=5)
WBC, UA: NONE SEEN WBC/HPF (ref <=5)
YEAST: NONE SEEN [HPF]

## 2015-08-28 LAB — URINE CULTURE

## 2015-09-01 NOTE — Progress Notes (Signed)
Encounter reviewed Jill Jertson, MD   

## 2015-10-31 ENCOUNTER — Other Ambulatory Visit: Payer: Self-pay | Admitting: Obstetrics and Gynecology

## 2015-10-31 ENCOUNTER — Other Ambulatory Visit: Payer: Self-pay | Admitting: Certified Nurse Midwife

## 2015-10-31 DIAGNOSIS — Z1231 Encounter for screening mammogram for malignant neoplasm of breast: Secondary | ICD-10-CM

## 2015-12-12 ENCOUNTER — Ambulatory Visit
Admission: RE | Admit: 2015-12-12 | Discharge: 2015-12-12 | Disposition: A | Discharge status: Home / Self Care | Payer: Self-pay | Source: Ambulatory Visit | Attending: Obstetrics and Gynecology | Admitting: Obstetrics and Gynecology

## 2015-12-12 DIAGNOSIS — Z1231 Encounter for screening mammogram for malignant neoplasm of breast: Secondary | ICD-10-CM

## 2016-05-07 ENCOUNTER — Ambulatory Visit (INDEPENDENT_AMBULATORY_CARE_PROVIDER_SITE_OTHER): Payer: 59 | Admitting: Obstetrics and Gynecology

## 2016-05-07 ENCOUNTER — Encounter: Payer: Self-pay | Admitting: Obstetrics and Gynecology

## 2016-05-07 VITALS — BP 132/80 | HR 80 | Resp 16 | Ht 67.0 in | Wt 270.0 lb

## 2016-05-07 DIAGNOSIS — Z124 Encounter for screening for malignant neoplasm of cervix: Secondary | ICD-10-CM

## 2016-05-07 DIAGNOSIS — Z01419 Encounter for gynecological examination (general) (routine) without abnormal findings: Secondary | ICD-10-CM | POA: Diagnosis not present

## 2016-05-07 DIAGNOSIS — R7303 Prediabetes: Secondary | ICD-10-CM | POA: Insufficient documentation

## 2016-05-07 NOTE — Progress Notes (Signed)
41 y.o. [id number] MarriedCaucasianF here for annual exam.  Not preventing, but not trying to get pregnant (uses w/d). Kids are 13 and 16, no contraception for 7-8 years.  42 year old daughter just diagnosed with Crohns, she has just started medication (remicade).  Infrequently sexually active, no pain.  She has lost 40 lbs since May, walking, eating better.  Period Cycle (Days): 28 Period Duration (Days): 5 days  Period Pattern: Regular Menstrual Flow: Moderate Menstrual Control: Other (Comment) Menstrual Control Change Freq (Hours): changes pad every 4 hours  Dysmenorrhea: None  Patient's last menstrual period was 04/23/2016.          Sexually active: Yes.    The current method of family planning is none.    Exercising: Yes.    walking Smoker:  no  Health Maintenance: Pap:  04-25-13 WNL NEG HR HPV/ 03-15-10 WNL History of abnormal Pap:  no MMG:  12-12-15 WNL Colonoscopy:  Never BMD:   Never TDaP:  unsure Gardasil: N/A   reports that she has never smoked. She has never used smokeless tobacco. She reports that she does not drink alcohol or use drugs.She works in [employer].   Past Medical History:  Diagnosis Date  . Diabetes mellitus    GESTATIONAL   . PCOS (polycystic ovarian syndrome)     Past Surgical History:  Procedure Laterality Date  . CERVICAL BIOPSY  W/ LOOP ELECTRODE EXCISION  age 41    Current Outpatient Prescriptions  Medication Sig Dispense Refill  . ibuprofen (ADVIL,MOTRIN) 800 MG tablet Take 1 tablet (800 mg total) by mouth every 8 (eight) hours as needed for pain. 30 tablet 1  . metFORMIN (GLUCOPHAGE-XR) 500 MG 24 hr tablet Take 2 tablets by mouth daily.   0  . phentermine 15 MG capsule Take 15 mg by mouth daily.  0  . spironolactone (ALDACTONE) 50 MG tablet Take 50 mg by mouth daily.   1  . triamcinolone cream (KENALOG) 0.1 % Apply 1 application topically 2 (two) times daily. 30 g 0   No current facility-administered medications for this visit.   She is seeing  her primary in March, may come off of the metformin. On spironolactone for hirsutism.   No family history on file.  Review of Systems  Constitutional: Negative.   HENT: Negative.   Eyes: Negative.   Respiratory: Negative.   Cardiovascular: Negative.   Gastrointestinal: Negative.   Endocrine: Negative.   Genitourinary: Negative.   Musculoskeletal: Negative.   Skin: Negative.   Allergic/Immunologic: Negative.   Neurological: Negative.   Psychiatric/Behavioral: Negative.     Exam:   BP 132/80 (BP Location: Right Arm, Patient Position: Sitting, Cuff Size: Normal)   Pulse 80   Resp 16   Ht 5\' 7"  (1.702 m)   Wt 270 lb (122.5 kg)   LMP 04/23/2016   BMI 42.29 kg/m   Weight change: @WEIGHTCHANGE @ Height:   Height: 5\' 7"  (170.2 cm)  Ht Readings from Last 3 Encounters:  05/07/16 5\' 7"  (1.702 m)  05/02/15 5' 7.25" (1.708 m)  05/05/14 5' 7.25" (1.708 m)    General appearance: alert, cooperative and appears stated age Head: Normocephalic, without obvious abnormality, atraumatic Neck: no adenopathy, supple, symmetrical, trachea midline and thyroid normal to inspection and palpation Lungs: clear to auscultation bilaterally Cardiovascular: regular rate and rhythm Breasts: normal appearance, no masses or tenderness Heart: regular rate and rhythm Abdomen: soft, non-tender; bowel sounds normal; no masses,  no organomegaly Extremities: extremities normal, atraumatic, no cyanosis or edema Skin:  Skin color, texture, turgor normal. No rashes or lesions Lymph nodes: Cervical, supraclavicular, and axillary nodes normal. No abnormal inguinal nodes palpated Neurologic: Grossly normal   Pelvic: External genitalia:  no lesions              Urethra:  normal appearing urethra with no masses, tenderness or lesions              Bartholins and Skenes: normal                 Vagina: normal appearing vagina with normal color and discharge, no lesions              Cervix: no lesions, +ectropion,  friable with pap               Bimanual Exam:  Uterus:  normal size, contour, position, consistency, mobility, non-tender              Adnexa: no mass, fullness, tenderness               Rectovaginal: Confirms               Anus:  normal sphincter tone, no lesions  Chaperone was present for exam.  A:  Well Woman with normal exam  P:   Pap with hpv  Mammogram in the fall  Labs and immunizations with her primary  Discussed PNV  She is very stressed about her daughter, just diagnosed with Crohn's disease  Discussed counseling and medication

## 2016-05-07 NOTE — Patient Instructions (Signed)

## 2016-05-09 LAB — IPS PAP TEST WITH HPV

## 2017-05-01 ENCOUNTER — Inpatient Hospital Stay (HOSPITAL_COMMUNITY)
Admission: AD | Admit: 2017-05-01 | Discharge: 2017-05-01 | Disposition: A | Discharge status: Home / Self Care | Payer: 59 | Source: Ambulatory Visit | Attending: Gynecology | Admitting: Gynecology

## 2017-05-01 ENCOUNTER — Encounter (HOSPITAL_COMMUNITY): Payer: Self-pay | Admitting: *Deleted

## 2017-05-01 ENCOUNTER — Telehealth: Payer: Self-pay | Admitting: Obstetrics and Gynecology

## 2017-05-01 DIAGNOSIS — Z7984 Long term (current) use of oral hypoglycemic drugs: Secondary | ICD-10-CM | POA: Diagnosis not present

## 2017-05-01 DIAGNOSIS — E282 Polycystic ovarian syndrome: Secondary | ICD-10-CM | POA: Diagnosis not present

## 2017-05-01 DIAGNOSIS — R1032 Left lower quadrant pain: Secondary | ICD-10-CM | POA: Diagnosis present

## 2017-05-01 DIAGNOSIS — R102 Pelvic and perineal pain: Secondary | ICD-10-CM | POA: Diagnosis present

## 2017-05-01 DIAGNOSIS — E119 Type 2 diabetes mellitus without complications: Secondary | ICD-10-CM | POA: Insufficient documentation

## 2017-05-01 DIAGNOSIS — Z79899 Other long term (current) drug therapy: Secondary | ICD-10-CM | POA: Insufficient documentation

## 2017-05-01 DIAGNOSIS — R110 Nausea: Secondary | ICD-10-CM | POA: Diagnosis present

## 2017-05-01 LAB — URINALYSIS, ROUTINE W REFLEX MICROSCOPIC
Bilirubin Urine: NEGATIVE
Glucose, UA: NEGATIVE mg/dL
Hgb urine dipstick: NEGATIVE
Ketones, ur: 20 mg/dL — AB
Nitrite: NEGATIVE
Protein, ur: NEGATIVE mg/dL
SPECIFIC GRAVITY, URINE: 1.018 (ref 1.005–1.030)
pH: 6 (ref 5.0–8.0)

## 2017-05-01 LAB — WET PREP, GENITAL
Clue Cells Wet Prep HPF POC: NONE SEEN
Sperm: NONE SEEN
Trich, Wet Prep: NONE SEEN
YEAST WET PREP: NONE SEEN

## 2017-05-01 LAB — POCT PREGNANCY, URINE: PREG TEST UR: NEGATIVE

## 2017-05-01 MED ORDER — PROMETHAZINE HCL 12.5 MG PO TABS: 12.5000 mg | ORAL_TABLET | Freq: Four times a day (QID) | ORAL | 0 refills | Status: DC | PRN

## 2017-05-01 MED ORDER — PROMETHAZINE HCL 25 MG/ML IJ SOLN
25.0000 mg | Freq: Once | INTRAMUSCULAR | Status: DC
  Filled 2017-05-01: qty 1

## 2017-05-01 MED ORDER — KETOROLAC TROMETHAMINE 60 MG/2ML IM SOLN
60.0000 mg | Freq: Once | INTRAMUSCULAR | Status: AC
  Administered 2017-05-01: 60 mg via INTRAMUSCULAR
  Filled 2017-05-01: qty 2

## 2017-05-01 MED ORDER — TRAMADOL HCL 50 MG PO TABS: 50.0000 mg | ORAL_TABLET | Freq: Four times a day (QID) | ORAL | 0 refills | Status: DC | PRN

## 2017-05-01 MED ORDER — KETOROLAC TROMETHAMINE 60 MG/2ML IM SOLN
60.0000 mg | Freq: Once | INTRAMUSCULAR | Status: DC
  Filled 2017-05-01: qty 2

## 2017-05-01 MED ORDER — PROMETHAZINE HCL 25 MG/ML IJ SOLN
25.0000 mg | Freq: Once | INTRAMUSCULAR | Status: AC
  Administered 2017-05-01: 25 mg via INTRAMUSCULAR

## 2017-05-01 MED ORDER — KETOROLAC TROMETHAMINE 10 MG PO TABS: 10.0000 mg | ORAL_TABLET | Freq: Four times a day (QID) | ORAL | 0 refills | Status: DC | PRN

## 2017-05-01 NOTE — Telephone Encounter (Signed)
Patient called and said she thinks she has a ruptured cyst and she's in extreme pain. Patient stated she did not think it's an emergency. Routing to triage for further assessment.  Last seen: 05/07/16

## 2017-05-01 NOTE — MAU Note (Signed)
Pt thinks she has a ruptured ovarian cyst, pain in LLQ that radiates up, some mid lower abd pain also.  Pain started last night, feels full & heavy in her abdomen.  Had some nausea & dry heaving last night, denies diarrhea.  No vaginal bleeding.

## 2017-05-01 NOTE — MAU Provider Note (Signed)
History     CSN: [id number]  Arrival date and time: 05/01/17 1120   First Provider Initiated Contact with Patient 05/01/17 1202      Chief Complaint  Patient presents with  . Abdominal Pain   HPI  Ms.  Monica Richmond is a 42 y.o. year old 632P2002 non-pregnant female at who presents to [facility] by request of her GYN office. She called her GYN office this morning with complaints of LLQ pain and nausea that started at 0100 this AM, "fullness and heaviness". She stated to the office RN that she has a h/o ovarian cysts and thinks this is what she has now. Office RN was instructed by Dr. Oscar LaJertson to have patient evaluated here in [facility]U. She took some ibuprofen at 0130 this AM. When she called the office her pain was rated a 7/10. Now she rates her pain a 7/10. She denies VB or N/V.  Past Medical History:  Diagnosis Date  . Diabetes mellitus    GESTATIONAL   . PCOS (polycystic ovarian syndrome)   . Prediabetes     Past Surgical History:  Procedure Laterality Date  . CERVICAL BIOPSY  W/ LOOP ELECTRODE EXCISION  age 42    History reviewed. No pertinent family history.  Social History   Tobacco Use  . Smoking status: Never Smoker  . Smokeless tobacco: Never Used  Substance Use Topics  . Alcohol use: No    Alcohol/week: 0.0 oz  . Drug use: No    Allergies: No Known Allergies  Medications Prior to Admission  Medication Sig Dispense Refill Last Dose  . ibuprofen (ADVIL,MOTRIN) 800 MG tablet Take 1 tablet (800 mg total) by mouth every 8 (eight) hours as needed for pain. 30 tablet 1 05/01/2017 at 0130  . metFORMIN (GLUCOPHAGE-XR) 500 MG 24 hr tablet Take 2 tablets by mouth daily.   0 04/30/2017 at Unknown time  . spironolactone (ALDACTONE) 50 MG tablet Take 50 mg by mouth daily.   1 04/30/2017 at Unknown time  . phentermine 15 MG capsule Take 15 mg by mouth daily.  0 More than a month at Unknown time  . triamcinolone cream (KENALOG) 0.1 % Apply 1 application topically 2 (two) times daily. 30 g  0 More than a month at Unknown time    Review of Systems  Constitutional: Negative.   HENT: Negative.   Eyes: Negative.   Respiratory: Negative.   Cardiovascular: Negative.   Gastrointestinal: Positive for nausea (improved).  Endocrine: Negative.   Genitourinary: Positive for pelvic pain (LT side).  Musculoskeletal: Negative.   Skin: Negative.   Allergic/Immunologic: Negative.   Neurological: Negative.   Hematological: Negative.   Psychiatric/Behavioral: Negative.    Physical Exam   Blood pressure (!) 142/73, pulse (!) 117, temperature 97.7 F (36.5 C), temperature source Oral, resp. rate 18, height 5\' 7"  (1.702 m), weight 273 lb (123.8 kg), last menstrual period 04/16/2017.  Physical Exam  Nursing note and vitals reviewed. Constitutional: She is oriented to person, place, and time. She appears well-developed and well-nourished.  HENT:  Head: Normocephalic and atraumatic.  Eyes: Pupils are equal, round, and reactive to light.  Neck: Normal range of motion.  Cardiovascular: Normal rate, regular rhythm and normal heart sounds.  Respiratory: Effort normal and breath sounds normal.  GI: Soft. Bowel sounds are normal.  Genitourinary:  Genitourinary Comments: Uterus: tender, cx: smooth, pink, no lesions, small amt of thick, white vaginal d/c - wet prep done, closed/long/firm, no CMT or friability, bilatertal adnexal tenderness  Musculoskeletal: Normal range of motion.  Neurological: She is alert and oriented to person, place, and time.  Skin: Skin is warm and dry.  Psychiatric: She has a normal mood and affect. Her behavior is normal. Judgment and thought content normal.    MAU Course  Procedures  MDM CCUA UPT Wet Prep  *Consult with Dr. Salli Quarry RN @ 1320 - notified of patient's complaints, assessments, tx plan d/c home with Rx for Ultram 50 mg every 6 hrs prn, especially for hs & Toradol 10 mg po every 6 hrs, F/U with GYN office prn  Results for orders placed or  performed during the hospital encounter of 05/01/17 (from the past 24 hour(s))  Urinalysis, Routine w reflex microscopic     Status: Abnormal   Collection Time: 05/01/17 11:35 AM  Result Value Ref Range   Color, Urine YELLOW YELLOW   APPearance HAZY (A) CLEAR   Specific Gravity, Urine 1.018 1.005 - 1.030   pH 6.0 5.0 - 8.0   Glucose, UA NEGATIVE NEGATIVE mg/dL   Hgb urine dipstick NEGATIVE NEGATIVE   Bilirubin Urine NEGATIVE NEGATIVE   Ketones, ur 20 (A) NEGATIVE mg/dL   Protein, ur NEGATIVE NEGATIVE mg/dL   Nitrite NEGATIVE NEGATIVE   Leukocytes, UA TRACE (A) NEGATIVE   RBC / HPF 0-5 0 - 5 RBC/hpf   WBC, UA 0-5 0 - 5 WBC/hpf   Bacteria, UA FEW (A) NONE SEEN   Squamous Epithelial / LPF 0-5 (A) NONE SEEN   Mucus PRESENT   Pregnancy, urine POC     Status: None   Collection Time: 05/01/17 11:55 AM  Result Value Ref Range   Preg Test, Ur NEGATIVE NEGATIVE  Wet prep, genital     Status: Abnormal   Collection Time: 05/01/17 12:10 PM  Result Value Ref Range   Yeast Wet Prep HPF POC NONE SEEN NONE SEEN   Trich, Wet Prep NONE SEEN NONE SEEN   Clue Cells Wet Prep HPF POC NONE SEEN NONE SEEN   WBC, Wet Prep HPF POC FEW (A) NONE SEEN   Sperm NONE SEEN      Assessment and Plan  Pelvic pain - Rx for Ultram 50 mg every 6 hrs prn, but especially hs - Rx for Toradol 10 mg every 6 hrs prn pain - Information provided on pelvic pain - Advised to F/U with GYN office prn - MAU GYN 2019 care changes info - Patient verbalized an understanding of the plan of care and agrees.    Monica Mora, MSN, CNM 05/01/2017, 12:02 PM

## 2017-05-01 NOTE — Telephone Encounter (Signed)
Spoke with patient. Patient states that she started having really sharp pain and pressure in her left pelvic area at 1 am. Pain radiates through pelvis and into her back. Took Ibuprofen at 1:30 am with relief. Woke up at 3 am nauseated and with sweats. No vomiting or fever. This morning pain is intermittent. 7/10. Reports she has had ovarian cysts before. Feels a heaviness/tenderness in pelvis. Advised will review with Dr.Jertson and return call.

## 2017-05-01 NOTE — Telephone Encounter (Signed)
Spoke with patient. Advised Dr.Jertson recommends that she been seen at [facility]U for further evaluation. Patient is agreeable and will head there at this time. Spoke with charge nurse Rachel to advise of patient coming for evaluation and to contact the office at [phone] with any care questions or information.  Routing to provider for final review. Patient agreeable to disposition. Will close encounter.  

## 2017-05-01 NOTE — Discharge Instructions (Signed)
In late 2019, the [facility] will be moving to the [place] campus. At that time, the MAU (Maternity Admissions Unit), where you are being seen today, will no longer take care of non-pregnant patients. We strongly encourage you to find a doctor's office before that time, so that you can be seen with any GYN concerns, like vaginal discharge, urinary tract infection, etc.. in a timely manner. ° °In order to make an office visit more convenient, the [facility] will be offering evening hours with same-day appointments, walk-in appointments and scheduled appointments available during this time. ° °[facility] Hours: °Monday - 8am - 7:30 pm with walk-in between 4pm- 7:30 pm °Tuesday - 8 am - 5 pm (starting 06/23/17 we will be open late and accepting walk-ins from 4pm - 7:30pm) °Wednesday - 8 am - 5 pm (starting 09/23/17 we will be open late and accepting walk-ins from 4pm - 7:30pm) °Thursday 8 am - 5 pm (starting 12/24/17 we will be open late and accepting walk-ins from 4pm - 7:30pm) °Friday 8 am - 5 pm ° °For an appointment please call the [facility] at [phone] ° °For urgent needs, [facility] is also available for management of urgent GYN complaints such as vaginal discharge or urinary tract infections. ° ° ° ° ° °

## 2017-05-20 ENCOUNTER — Ambulatory Visit (INDEPENDENT_AMBULATORY_CARE_PROVIDER_SITE_OTHER): Payer: 59 | Admitting: Obstetrics and Gynecology

## 2017-05-20 ENCOUNTER — Encounter: Payer: Self-pay | Admitting: Obstetrics and Gynecology

## 2017-05-20 ENCOUNTER — Other Ambulatory Visit: Payer: Self-pay

## 2017-05-20 VITALS — BP 122/82 | HR 84 | Resp 16 | Ht 67.5 in | Wt 275.0 lb

## 2017-05-20 DIAGNOSIS — Z01419 Encounter for gynecological examination (general) (routine) without abnormal findings: Secondary | ICD-10-CM

## 2017-05-20 DIAGNOSIS — R102 Pelvic and perineal pain: Secondary | ICD-10-CM | POA: Diagnosis not present

## 2017-05-20 DIAGNOSIS — N939 Abnormal uterine and vaginal bleeding, unspecified: Secondary | ICD-10-CM

## 2017-05-20 DIAGNOSIS — Z0000 Encounter for general adult medical examination without abnormal findings: Secondary | ICD-10-CM | POA: Diagnosis not present

## 2017-05-20 LAB — POCT URINALYSIS DIPSTICK
Bilirubin, UA: NEGATIVE
Blood, UA: NEGATIVE
GLUCOSE UA: NEGATIVE
Ketones, UA: NEGATIVE
LEUKOCYTES UA: NEGATIVE
Nitrite, UA: NEGATIVE
PROTEIN UA: NEGATIVE
pH, UA: 6 (ref 5.0–8.0)

## 2017-05-20 LAB — POCT URINE PREGNANCY: Preg Test, Ur: NEGATIVE

## 2017-05-20 MED ORDER — NYSTATIN 100000 UNIT/GM EX CREA: 1.0000 "application " | TOPICAL_CREAM | Freq: Two times a day (BID) | CUTANEOUS | 0 refills | Status: DC

## 2017-05-20 NOTE — Progress Notes (Signed)
42 y.o. [id number] MarriedCaucasianF here for annual exam.   She was seen in the ER earlier this month with pelvic pain, she was mid cycle. She had a pelvic exam, got pain meds and was sent home. She is still having some intermittent pelvic pain, hurts more when she tries to have a BM. The pain is on her left side and into her lower back. Up to a 6-7/10 in severity.  She has a rectocele, just notices it when she is in the shower or wiping. In the last few days she has pressure in her pelvis after she voids. No urinary urgency, frequency or pain. She c/o GSI about 1 x a week, when she sneezes hard, small amount.  No problem with BM. Infrequently sexually active, no pain.  Under lots of stress with her daughter, not depressed. Reviewed period tracker cycles have been every 23-30 days, mainly for 6 days, one x 8 days one x 9 days since last May. Changes a pad in 4 hours. LMP was 05/11/17 and is still spotting.  Period Cycle (Days): 28 Period Duration (Days): 2-10 day Period Pattern: Regular Menstrual Flow: Light, Heavy Menstrual Control: Other (Comment) Dysmenorrhea: (!) Moderate Dysmenorrhea Symptoms: Cramping  Daughter has Crohn's, not under control. Was hospitalized earlier this month.   Patient's last menstrual period was 05/11/2017.          Sexually active: Yes.    The current method of family planning is none.    Exercising: Yes.    walking Smoker:  no  Health Maintenance: Pap:  05-07-16 WNL NEG HR HPV 04-25-13 WNl NEG HR HPV  History of abnormal Pap:  no MMG:  03-15-10 WNL  Colonoscopy:  Never BMD:   Never TDaP:  Unsure, UTD Gardasil: no    reports that  has never smoked. she has never used smokeless tobacco. She reports that she does not drink alcohol or use drugs.Kids are 3514 (girl) and 116 (boy). She works in [employer].    Past Medical History:  Diagnosis Date  . Diabetes mellitus    GESTATIONAL   . PCOS (polycystic ovarian syndrome)   . Prediabetes     Past Surgical History:   Procedure Laterality Date  . CERVICAL BIOPSY  W/ LOOP ELECTRODE EXCISION  age 42    Current Outpatient Medications  Medication Sig Dispense Refill  . metFORMIN (GLUCOPHAGE-XR) 500 MG 24 hr tablet Take 2 tablets by mouth daily.   0  . spironolactone (ALDACTONE) 50 MG tablet Take 50 mg by mouth daily.   1   No current facility-administered medications for this visit.     Family History  Problem Relation Age of Onset  . Crohn's disease Daughter [id number]    Review of Systems  Constitutional: Negative.   HENT: Negative.   Eyes: Negative.   Respiratory: Negative.   Cardiovascular: Negative.   Gastrointestinal: Negative.   Endocrine: Negative.   Genitourinary: Positive for pelvic pain.       Loss of urine with sneeze/ cough  Musculoskeletal: Positive for back pain.  Skin: Negative.   Allergic/Immunologic: Negative.   Neurological: Negative.   Psychiatric/Behavioral: Negative.     Exam:   BP 122/82 (BP Location: Right Arm, Patient Position: Sitting, Cuff Size: Normal)   Pulse 84   Resp 16   Ht 5' 7.5" (1.715 m)   Wt 275 lb (124.7 kg)   LMP 05/11/2017   BMI 42.44 kg/m   Weight change: @WEIGHTCHANGE @ Height:   Height: 5' 7.5" (171.5 cm)  Ht  Readings from Last 3 Encounters:  05/20/17 5' 7.5" (1.715 m)  05/01/17 5\' 7"  (1.702 m)  05/07/16 5\' 7"  (1.702 m)    General appearance: alert, cooperative and appears stated age Head: Normocephalic, without obvious abnormality, atraumatic Neck: no adenopathy, supple, symmetrical, trachea midline and thyroid normal to inspection and palpation Lungs: clear to auscultation bilaterally Cardiovascular: regular rate and rhythm Breasts: normal appearance, no masses or tenderness Abdomen: soft, tender in BLQ, no rebound or guarding, non distended,  no masses,  no organomegaly Extremities: extremities normal, atraumatic, no cyanosis or edema Skin: Skin color, texture, turgor normal. Under her panus she has a patchy, erythematous rash Lymph  nodes: Cervical, supraclavicular, and axillary nodes normal. No abnormal inguinal nodes palpated Neurologic: Grossly normal   Pelvic: External genitalia:  no lesions              Urethra:  normal appearing urethra with no masses, tenderness or lesions              Bartholins and Skenes: normal                 Vagina: normal appearing vagina with a small grade 2 rectocele             Cervix: no cervical motion tenderness and no lesions               Bimanual Exam:  Uterus:  anteverted, mobile, mildly tender, normal sized              Adnexa: no mass, fullness, tenderness               Rectovaginal: tender on RV exam, some fullness on the right and posteriorly               Anus:  normal sphincter tone, no lesions  Chaperone was present for exam.  A:  Well Woman with normal exam  Pelvic pain, tender on abdominal and pelvic exam  Rectocele, asymptomatic   GSI, mild   AUB, bleeding now since 2/18, prior 9 days cycle  Candida intertrigo, on questioning she c/o irritation  Pre-diabetes, being followed by Dr Talmage Nap  P:   Screening labs with Dr Talmage Nap  CBC, TSH today  UPT negative  Urine dip negative  No pap this year  Return for pelvic ultrasound  Mammogram  Discussed breast self exam  Discussed calcium and vit D intake  Nystatin cream

## 2017-05-21 ENCOUNTER — Telehealth: Payer: Self-pay | Admitting: Obstetrics and Gynecology

## 2017-05-21 LAB — CBC WITH DIFFERENTIAL/PLATELET
BASOS: 1 %
Basophils Absolute: 0 10*3/uL (ref 0.0–0.2)
EOS (ABSOLUTE): 0.1 10*3/uL (ref 0.0–0.4)
EOS: 1 %
HEMATOCRIT: 40.8 % (ref 34.0–46.6)
HEMOGLOBIN: 13.3 g/dL (ref 11.1–15.9)
Immature Grans (Abs): 0 10*3/uL (ref 0.0–0.1)
Immature Granulocytes: 0 %
LYMPHS ABS: 1.7 10*3/uL (ref 0.7–3.1)
Lymphs: 24 %
MCH: 28.4 pg (ref 26.6–33.0)
MCHC: 32.6 g/dL (ref 31.5–35.7)
MCV: 87 fL (ref 79–97)
MONOCYTES: 8 %
MONOS ABS: 0.6 10*3/uL (ref 0.1–0.9)
NEUTROS ABS: 4.6 10*3/uL (ref 1.4–7.0)
Neutrophils: 66 %
Platelets: 308 10*3/uL (ref 150–379)
RBC: 4.69 x10E6/uL (ref 3.77–5.28)
RDW: 14.9 % (ref 12.3–15.4)
WBC: 6.9 10*3/uL (ref 3.4–10.8)

## 2017-05-21 LAB — TSH: TSH: 1.55 u[IU]/mL (ref 0.450–4.500)

## 2017-05-21 NOTE — Telephone Encounter (Signed)
Spoke with patient regarding benefit for recommended ultrasound. Patient understood and agreeable. Patient states she works for a [employer] and its difficult to take time off. Patient states she will call back when she can schedule.   Routing to Dr Oscar LaJertson  cc: Billie RuddySally Yeakley, RN

## 2017-05-22 NOTE — Telephone Encounter (Signed)
Please check with her in a month if we haven't heard back from her. Thanks

## 2017-05-25 NOTE — Telephone Encounter (Signed)
Thomasene LotSuzy, lets delay order for 30 days and call her back.   Encounter closed.

## 2017-06-23 ENCOUNTER — Telehealth: Payer: Self-pay | Admitting: *Deleted

## 2017-06-23 NOTE — Telephone Encounter (Signed)
Follow-up call to patient. Per ROI, can leave message on voice mail which has first and last name confirmation.  Left message calling to follow-up on recommended pelvic ultrasound and how she was doing since last office visit.  Left message to call back.

## 2017-06-25 NOTE — Telephone Encounter (Signed)
Since her pain is gone, you can cancel her ultrasound. Please have her continue to calendar her cycles. If she continues to bleed for 9 days a month she should be further evaluated.

## 2017-06-25 NOTE — Telephone Encounter (Signed)
Left message to call Shenise Wolgamott at [phone]. 

## 2017-06-25 NOTE — Telephone Encounter (Signed)
Call placed to patient to follow up in reference to scheduling recommended ultrasound. Patient states, "I'm actually feeling a lot better" adding "the symptoms dissipated about one and a half weeks after her appointment." Patient states she feels she no longer needs the ultrasound. I advised patient I will forward to Dr Oscar LaJertson to review.  Forwarding to Dr Oscar LaJertson for review  cc: Billie RuddySally Yeakley, RN

## 2017-07-01 NOTE — Telephone Encounter (Signed)
Left message to call [name] at [phone]. 

## 2017-07-07 NOTE — Telephone Encounter (Signed)
Dr.Jertson, I have attempted to reach the patient x 2 with no return call. Please advise.

## 2017-07-07 NOTE — Telephone Encounter (Signed)
Please send her a letter letting her know that we have canceled her ultrasound. She should continue to calendar her cycles and call if she continues to bleed for more than 7 days in a row.

## 2017-07-09 NOTE — Telephone Encounter (Signed)
Letter to Dr.Jertson. 

## 2017-07-15 NOTE — Telephone Encounter (Signed)
Letter mailed to home address on file.

## 2018-05-21 ENCOUNTER — Telehealth: Payer: Self-pay | Admitting: Obstetrics and Gynecology

## 2018-05-21 NOTE — Telephone Encounter (Signed)
Left message regarding upcoming appointment has been canceled and needs to be rescheduled. °

## 2018-06-08 ENCOUNTER — Ambulatory Visit: Payer: 59 | Admitting: Obstetrics and Gynecology

## 2019-06-10 ENCOUNTER — Ambulatory Visit
Admission: EM | Admit: 2019-06-10 | Discharge: 2019-06-10 | Disposition: A | Discharge status: Home / Self Care | Payer: 59 | Attending: Physician Assistant | Admitting: Physician Assistant

## 2019-06-10 ENCOUNTER — Other Ambulatory Visit: Payer: Self-pay

## 2019-06-10 ENCOUNTER — Ambulatory Visit (INDEPENDENT_AMBULATORY_CARE_PROVIDER_SITE_OTHER): Payer: 59

## 2019-06-10 ENCOUNTER — Encounter: Payer: Self-pay | Admitting: Emergency Medicine

## 2019-06-10 DIAGNOSIS — R0602 Shortness of breath: Secondary | ICD-10-CM

## 2019-06-10 DIAGNOSIS — R000 Tachycardia, unspecified: Secondary | ICD-10-CM | POA: Diagnosis not present

## 2019-06-10 DIAGNOSIS — Z20822 Contact with and (suspected) exposure to covid-19: Secondary | ICD-10-CM

## 2019-06-10 DIAGNOSIS — J189 Pneumonia, unspecified organism: Secondary | ICD-10-CM

## 2019-06-10 LAB — POCT URINALYSIS DIP (MANUAL ENTRY)
Bilirubin, UA: NEGATIVE
Blood, UA: NEGATIVE
Glucose, UA: NEGATIVE mg/dL
Ketones, POC UA: NEGATIVE mg/dL
Leukocytes, UA: NEGATIVE
Nitrite, UA: NEGATIVE
Spec Grav, UA: 1.015 (ref 1.010–1.025)
Urobilinogen, UA: 0.2 E.U./dL
pH, UA: 7.5 (ref 5.0–8.0)

## 2019-06-10 LAB — POCT URINE PREGNANCY: Preg Test, Ur: NEGATIVE

## 2019-06-10 MED ORDER — ALBUTEROL SULFATE HFA 108 (90 BASE) MCG/ACT IN AERS
2.0000 | INHALATION_SPRAY | Freq: Once | RESPIRATORY_TRACT | Status: AC
  Administered 2019-06-10: 2 via RESPIRATORY_TRACT

## 2019-06-10 NOTE — ED Triage Notes (Addendum)
Pt presents to [facility] for assessment of 6 days of nasal congestion, body aches, loss of sense of taste and smell.  Patient states starting this morning she began to have upper mid chest pain with deep inspiration, and an increase in the tightness in her lungs.  Patient c/o dry mouth and desire to drink water constantly.

## 2019-06-10 NOTE — ED Notes (Signed)
Patient able to ambulate independently  

## 2019-06-10 NOTE — ED Provider Notes (Signed)
[name]    CSN: [id number] Arrival date & time: 06/10/19  0913      History   Chief Complaint Chief Complaint  Patient presents with  . URI    HPI Monica Richmond is a 44 y.o. female.   44 year old female comes in for 6 day of COVID like symptoms. Has had nasal congestion, rhinorrhea, cough, body aches, loss of taste/smell. States had multiple positive COVID exposures, and self quarantined when symptoms started as she assumed she had it. However, this morning, developed mid chest pain with deep inspiration and tightness to the chest. Has had to discontinue certain activities due to chest tightness/shortness of breath. Fever, tmax 101, responsive to antipyretic. Nausea without vomiting this morning. Denies abdominal pain, diarrhea.   CBG this morning 112. Had one episode hypoglycemia, which improved after having some candy. States due to loss of taste/smell, has not been eating. Last A1c 5.7, on metformin.      Past Medical History:  Diagnosis Date  . Diabetes mellitus   . PCOS (polycystic ovarian syndrome)   . Prediabetes     Patient Active Problem List   Diagnosis Date Noted  . Pelvic pain 05/01/2017  . Prediabetes   . Diabetes mellitus     Past Surgical History:  Procedure Laterality Date  . CERVICAL BIOPSY  W/ LOOP ELECTRODE EXCISION  age 79    OB History    Gravida  2   Para  2   Term  2   Preterm      AB      Living  2     SAB      TAB      Ectopic      Multiple      Live Births  2            Home Medications    Prior to Admission medications   Medication Sig Start Date End Date Taking? Authorizing Provider  metFORMIN (GLUCOPHAGE-XR) 500 MG 24 hr tablet Take 2 tablets by mouth daily.  07/09/15   [provider]  nystatin cream (MYCOSTATIN) Apply 1 application topically 2 (two) times daily. Apply to affected area BID for up to 7 days. 05/20/17   Romualdo Bolk, MD  spironolactone (ALDACTONE) 50 MG  tablet Take 50 mg by mouth daily.  05/04/14   [provider]    Family History Family History  Problem Relation Age of Onset  . Crohn's disease Daughter 74    Social History Social History   Tobacco Use  . Smoking status: Never Smoker  . Smokeless tobacco: Never Used  Substance Use Topics  . Alcohol use: No    Alcohol/week: 0.0 standard drinks  . Drug use: No     Allergies   Patient has no known allergies.   Review of Systems Review of Systems  Reason unable to perform ROS: See HPI as above.     Physical Exam Triage Vital Signs ED Triage Vitals  Enc Vitals Group     BP 06/10/19 0919 (!) 147/105     Pulse Rate 06/10/19 0919 (!) 108     Resp 06/10/19 0919 20     Temp 06/10/19 0919 97.9 F (36.6 C)     Temp Source 06/10/19 0919 Temporal     SpO2 06/10/19 0919 96 %     Weight --      Height --      Head Circumference --  Peak Flow --      Pain Score 06/10/19 0922 7     Pain Loc --      Pain Edu? --      Excl. in GC? --    No data found.  Updated Vital Signs BP (!) 147/105 (BP Location: Left Arm)   Pulse (!) 108   Temp 97.9 F (36.6 C) (Temporal)   Resp 20   LMP 05/01/2019   SpO2 96%   Physical Exam Constitutional:      General: She is not in acute distress.    Appearance: Normal appearance. She is not ill-appearing, toxic-appearing or diaphoretic.  HENT:     Head: Normocephalic and atraumatic.     Mouth/Throat:     Mouth: Mucous membranes are moist.     Pharynx: Oropharynx is clear. Uvula midline.  Cardiovascular:     Rate and Rhythm: Regular rhythm. Tachycardia present.     Heart sounds: Normal heart sounds. No murmur. No friction rub. No gallop.   Pulmonary:     Effort: Pulmonary effort is normal. No accessory muscle usage, prolonged expiration, respiratory distress or retractions.     Comments: Lungs clear to auscultation without adventitious lung sounds. Chest:     Chest wall: No tenderness.  Musculoskeletal:     Cervical  back: Normal range of motion and neck supple.  Neurological:     General: No focal deficit present.     Mental Status: She is alert and oriented to person, place, and time.      UC Treatments / Results  Labs (all labs ordered are listed, but only abnormal results are displayed) Labs Reviewed  POCT URINALYSIS DIP (MANUAL ENTRY) - Abnormal; Notable for the following components:      Result Value   Protein Ur, POC trace (*)    All other components within normal limits  NOVEL CORONAVIRUS, NAA  POCT URINE PREGNANCY    EKG   Radiology DG Chest 2 View  Result Date: 06/10/2019 CLINICAL DATA:  Tachycardia and SOB. Suspect COVID EXAM: CHEST - 2 VIEW COMPARISON:  None. FINDINGS: Normal heart size. No pleural effusion or edema. There are bilateral patchy airspace opacities in both lungs concerning for multifocal pneumonia. Visualized osseous structures are unremarkable. IMPRESSION: Bilateral patchy airspace opacities concerning for multifocal pneumonia. Electronically Signed   By: Signa Kell M.D.   On: 06/10/2019 10:14    Procedures Procedures (including critical care time)  Medications Ordered in UC Medications  albuterol (VENTOLIN HFA) 108 (90 Base) MCG/ACT inhaler 2 puff (2 puffs Inhalation Given 06/10/19 1030)    Initial Impression / Assessment and Plan / UC Course  I have reviewed the triage vital signs and the nursing notes.  Pertinent labs & imaging results that were available during my care of the patient were reviewed by me and considered in my medical decision making (see chart for details).    44 year old female with history of non-insulin-dependent diabetes comes in with 6-day history of Covid-like symptoms.  Today, woke up with pleuritic chest pain, chest tightness, shortness of breath, and therefore came in for evaluation.  She also complains of dry mouth, and polydipsia. DM well controlled, last a1c 5.7.  CBG this morning 112.  Patient is tachycardic at 108 at  triage.  She is afebrile without tachypnea, hypoxia.  Heart tachycardic, normal rhythm.  Lungs clear to auscultation bilaterally without adventitious lung sounds.  Mucosal membrane moist.  Chest x-ray will obtain for suspected Covid now with shortness of breath.  PCR Covid testing sent.  We will also obtain urine dipstick to evaluate for dehydration versus possible acidosis.  Chest x-ray with multifocal pneumonia consistent with Covid pneumonia.  Dipstick with trace protein, no ketones, glucose, increase in specific gravity.  At this time, low suspicion for superimposed bacterial pneumonia, will continue to monitor.  Discussed dry mouth, polydipsia, tachycardia most likely due to dehydration, will have patient push fluids for now.  Discussed may need to return for IV fluids if symptoms not improving.  Will start albuterol as directed.  Given history of diabetes, currently without hypoxia, will defer prednisone for now.  Strict return precautions given.  Patient expresses understanding and agrees to plan.  Final Clinical Impressions(s) / UC Diagnoses   Final diagnoses:  Suspected COVID-19 virus infection  Multifocal pneumonia   ED Prescriptions    None     PDMP not reviewed this encounter.   Ok Edwards, PA-C 06/10/19 1126

## 2019-06-10 NOTE — Discharge Instructions (Signed)
Your x-ray showed multifocal pneumonia, this is consistent with Covid pneumonia, and currently with your history and exam, most likely still viral pneumonia.  We will continue to monitor, albuterol as needed for shortness of breath. Keep hydrated, urine should be clear to pale yellow in color. Try to eat small nutritious meals. If fever returns (over 100.4), please give [place] a call. Please let us know if albuterol is no longer helping for shortness of breath. If sudden significant worsening of symptoms, go to the emergency department for further evaluation.

## 2019-06-11 LAB — NOVEL CORONAVIRUS, NAA: SARS-CoV-2, NAA: DETECTED — AB

## 2019-06-13 ENCOUNTER — Telehealth: Payer: Self-pay | Admitting: Unknown Physician Specialty

## 2019-06-13 ENCOUNTER — Ambulatory Visit (HOSPITAL_COMMUNITY)
Admission: RE | Admit: 2019-06-13 | Discharge: 2019-06-13 | Disposition: A | Discharge status: Home / Self Care | Payer: 59 | Source: Ambulatory Visit | Attending: Pulmonary Disease | Admitting: Pulmonary Disease

## 2019-06-13 ENCOUNTER — Other Ambulatory Visit: Payer: Self-pay | Admitting: Unknown Physician Specialty

## 2019-06-13 DIAGNOSIS — U071 COVID-19: Secondary | ICD-10-CM

## 2019-06-13 MED ORDER — SODIUM CHLORIDE 0.9 % IV SOLN
INTRAVENOUS | Status: DC | PRN
  Administered 2019-06-13: 12:00:00 250 mL via INTRAVENOUS

## 2019-06-13 MED ORDER — METHYLPREDNISOLONE SODIUM SUCC 125 MG IJ SOLR: 125.0000 mg | Freq: Once | INTRAMUSCULAR | Status: DC | PRN

## 2019-06-13 MED ORDER — ALBUTEROL SULFATE HFA 108 (90 BASE) MCG/ACT IN AERS: 2.0000 | INHALATION_SPRAY | Freq: Once | RESPIRATORY_TRACT | Status: DC | PRN

## 2019-06-13 MED ORDER — DIPHENHYDRAMINE HCL 50 MG/ML IJ SOLN: 50.0000 mg | Freq: Once | INTRAMUSCULAR | Status: DC | PRN

## 2019-06-13 MED ORDER — SODIUM CHLORIDE 0.9 % IV SOLN
700.0000 mg | Freq: Once | INTRAVENOUS | Status: AC
  Administered 2019-06-13: 700 mg via INTRAVENOUS
  Filled 2019-06-13: qty 700

## 2019-06-13 MED ORDER — FAMOTIDINE IN NACL 20-0.9 MG/50ML-% IV SOLN: 20.0000 mg | Freq: Once | INTRAVENOUS | Status: DC | PRN

## 2019-06-13 MED ORDER — EPINEPHRINE 0.3 MG/0.3ML IJ SOAJ: 0.3000 mg | Freq: Once | INTRAMUSCULAR | Status: DC | PRN

## 2019-06-13 NOTE — Telephone Encounter (Signed)
  I connected by phone with Monica Richmond on 06/13/2019 at 8:37 AM to discuss the potential use of an new treatment for mild to moderate COVID-19 viral infection in non-hospitalized patients.  This patient is a 44 y.o. female that meets the FDA criteria for Emergency Use Authorization of bamlanivimab or casirivimab\imdevimab.  Has a (+) direct SARS-CoV-2 viral test result  Has mild or moderate COVID-19   Is ? 45 years of age and weighs ? 40 kg  Is NOT hospitalized due to COVID-19  Is NOT requiring oxygen therapy or requiring an increase in baseline oxygen flow rate due to COVID-19  Is within 10 days of symptom onset  Has at least one of the high risk factor(s) for progression to severe COVID-19 and/or hospitalization as defined in EUA.  Specific high risk criteria : BMI >/= 35   I have spoken and communicated the following to the patient or parent/caregiver:  1. FDA has authorized the emergency use of bamlanivimab and casirivimab\imdevimab for the treatment of mild to moderate COVID-19 in adults and pediatric patients with positive results of direct SARS-CoV-2 viral testing who are 64 years of age and older weighing at least 40 kg, and who are at high risk for progressing to severe COVID-19 and/or hospitalization.  2. The significant known and potential risks and benefits of bamlanivimab and casirivimab\imdevimab, and the extent to which such potential risks and benefits are unknown.  3. Information on available alternative treatments and the risks and benefits of those alternatives, including clinical trials.  4. Patients treated with bamlanivimab and casirivimab\imdevimab should continue to self-isolate and use infection control measures (e.g., wear mask, isolate, social distance, avoid sharing personal items, clean and disinfect "high touch" surfaces, and frequent handwashing) according to CDC guidelines.   5. The patient or parent/caregiver has the option to accept or refuse  bamlanivimab or casirivimab\imdevimab .  After reviewing this information with the patient, The patient agreed to proceed with receiving the bamlanimivab infusion and will be provided a copy of the Fact sheet prior to receiving the infusion.Monica Richmond 06/13/2019 8:37 AM  Sx onset 8 days ago

## 2019-06-13 NOTE — Progress Notes (Signed)
  Diagnosis: COVID-19  Physician: Dr. Wright  Procedure: Cov[facility] Infusion Clinic Med: bamlanivimab infusion - Provided patient with bamlanimivab fact sheet for patients, parents and caregivers prior to infusion.  Complications: No immediate complications noted.  Discharge: Discharged home   Macee Venables S Journee Bobrowski 06/13/2019  

## 2019-06-13 NOTE — Discharge Instructions (Signed)

## 2019-06-13 NOTE — Telephone Encounter (Signed)
Called to discuss with patient about Covid symptoms and the use of bamlanivimab, a monoclonal antibody infusion for those with mild to moderate Covid symptoms and at a high risk of hospitalization.  Pt is qualified for this infusion at the [facility] due to BMI>35   Message left to call back  

## 2020-03-18 ENCOUNTER — Ambulatory Visit
Admission: EM | Admit: 2020-03-18 | Discharge: 2020-03-18 | Disposition: A | Discharge status: Home / Self Care | Payer: 59 | Attending: Emergency Medicine | Admitting: Emergency Medicine

## 2020-03-18 ENCOUNTER — Other Ambulatory Visit: Payer: Self-pay

## 2020-03-18 DIAGNOSIS — J22 Unspecified acute lower respiratory infection: Secondary | ICD-10-CM

## 2020-03-18 MED ORDER — DOXYCYCLINE HYCLATE 100 MG PO CAPS: 100.0000 mg | ORAL_CAPSULE | Freq: Two times a day (BID) | ORAL | 0 refills | Status: AC

## 2020-03-18 MED ORDER — PREDNISONE 50 MG PO TABS: 50.0000 mg | ORAL_TABLET | Freq: Every day | ORAL | 0 refills | Status: AC

## 2020-03-18 MED ORDER — ALBUTEROL SULFATE HFA 108 (90 BASE) MCG/ACT IN AERS: 1.0000 | INHALATION_SPRAY | Freq: Four times a day (QID) | RESPIRATORY_TRACT | 0 refills | Status: AC | PRN

## 2020-03-18 MED ORDER — BENZONATATE 200 MG PO CAPS: 200.0000 mg | ORAL_CAPSULE | Freq: Three times a day (TID) | ORAL | 0 refills | Status: AC | PRN

## 2020-03-18 NOTE — ED Triage Notes (Signed)
Patient states she has had cough, congestion, and an intermittent sore throat since around December 9th. Pt states it feels like bronchitis. Pt states no fever or ear aches. Pt is aox4 and ambulatory.

## 2020-03-18 NOTE — Discharge Instructions (Signed)
Begin doxycycline twice daily for 10 days Prednisone daily for 5 days-monitor sugars and be sure to drink plenty of fluids Tessalon for cough Albuterol inhaler as needed for chest tightness shortness of breath and wheezing May continue over-the-counter Robitussin Follow-up if not improving or worsening

## 2020-03-18 NOTE — ED Provider Notes (Signed)
[name]    CSN: [id number] Arrival date & time: 03/18/20  2706      History   Chief Complaint Chief Complaint  Patient presents with  . Cough    Since 9th of December  . Nasal Congestion    Since 9th December  . Sore Throat    Intermittently    HPI Monica Richmond is a 44 y.o. female history of DM type II, presenting today for evaluation of URI symptoms.  Patient has had cough and sore throat.  Reports she has had a persistent cough since 12/9 for the past 2-1/2 weeks.  Reports associated shortness of breath and wheezing.  Today of asthma.  Denies any persistent fevers.  Occasionally with pressure and sensitivity around the ears.  HPI  Past Medical History:  Diagnosis Date  . Diabetes mellitus   . PCOS (polycystic ovarian syndrome)   . Prediabetes     Patient Active Problem List   Diagnosis Date Noted  . Pelvic pain 05/01/2017  . Prediabetes   . Diabetes mellitus     Past Surgical History:  Procedure Laterality Date  . CERVICAL BIOPSY  W/ LOOP ELECTRODE EXCISION  age 69    OB History    Gravida  2   Para  2   Term  2   Preterm      AB      Living  2     SAB      IAB      Ectopic      Multiple      Live Births  2            Home Medications    Prior to Admission medications   Medication Sig Start Date End Date Taking? Authorizing Provider  metFORMIN (GLUCOPHAGE-XR) 500 MG 24 hr tablet Take 2 tablets by mouth daily.  07/09/15  Yes [provider]  spironolactone (ALDACTONE) 50 MG tablet Take 50 mg by mouth daily.  05/04/14  Yes [provider]  albuterol (VENTOLIN HFA) 108 (90 Base) MCG/ACT inhaler Inhale 1-2 puffs into the lungs every 6 (six) hours as needed for wheezing or shortness of breath. 03/18/20   Andyn Sales C, PA-C  benzonatate (TESSALON) 200 MG capsule Take 1 capsule (200 mg total) by mouth 3 (three) times daily as needed for up to 7 days for cough. 03/18/20 03/25/20  Angelina Neece C,  PA-C  doxycycline (VIBRAMYCIN) 100 MG capsule Take 1 capsule (100 mg total) by mouth 2 (two) times daily for 10 days. 03/18/20 03/28/20  Dawaun Brancato C, PA-C  predniSONE (DELTASONE) 50 MG tablet Take 1 tablet (50 mg total) by mouth daily with breakfast for 5 days. 03/18/20 03/23/20  Flay Ghosh, Junius Creamer, PA-C    Family History Family History  Problem Relation Age of Onset  . Crohn's disease Daughter 49    Social History Social History   Tobacco Use  . Smoking status: Never Smoker  . Smokeless tobacco: Never Used  Vaping Use  . Vaping Use: Never used  Substance Use Topics  . Alcohol use: No    Alcohol/week: 0.0 standard drinks  . Drug use: No     Allergies   Patient has no known allergies.   Review of Systems Review of Systems  Constitutional: Negative for activity change, appetite change, chills, fatigue and fever.  HENT: Positive for congestion and rhinorrhea. Negative for ear pain, sinus pressure, sore throat and trouble swallowing.   Eyes: Negative for discharge and redness.  Respiratory: Positive for cough. Negative for chest tightness and shortness of breath.   Cardiovascular: Negative for chest pain.  Gastrointestinal: Negative for abdominal pain, diarrhea, nausea and vomiting.  Musculoskeletal: Negative for myalgias.  Skin: Negative for rash.  Neurological: Negative for dizziness, light-headedness and headaches.     Physical Exam Triage Vital Signs ED Triage Vitals  Enc Vitals Group     BP      Pulse      Resp      Temp      Temp src      SpO2      Weight      Height      Head Circumference      Peak Flow      Pain Score      Pain Loc      Pain Edu?      Excl. in GC?    No data found.  Updated Vital Signs BP (!) 157/108 (BP Location: Left Arm)   Pulse (!) 110   Temp 98.1 F (36.7 C) (Oral)   Resp 18   SpO2 97%   Visual Acuity Right Eye Distance:   Left Eye Distance:   Bilateral Distance:    Right Eye Near:   Left Eye Near:     Bilateral Near:     Physical Exam Vitals and nursing note reviewed.  Constitutional:      Appearance: She is well-developed and well-nourished.     Comments: No acute distress  HENT:     Head: Normocephalic and atraumatic.     Ears:     Comments: Bilateral ears without tenderness to palpation of external auricle, tragus and mastoid, EAC's without erythema or swelling, TM's with good bony landmarks and cone of light. Non erythematous.     Nose: Nose normal.     Mouth/Throat:     Comments: Oral mucosa pink and moist, no tonsillar enlargement or exudate. Posterior pharynx patent and nonerythematous, no uvula deviation or swelling. Normal phonation. Eyes:     Conjunctiva/sclera: Conjunctivae normal.  Cardiovascular:     Rate and Rhythm: Normal rate.  Pulmonary:     Effort: Pulmonary effort is normal. No respiratory distress.     Comments: Breathing comfortably at rest, CTABL, no wheezing, rales or other adventitious sounds auscultated Abdominal:     General: There is no distension.  Musculoskeletal:        General: Normal range of motion.     Cervical back: Neck supple.  Skin:    General: Skin is warm and dry.  Neurological:     Mental Status: She is alert and oriented to person, place, and time.  Psychiatric:        Mood and Affect: Mood and affect normal.      UC Treatments / Results  Labs (all labs ordered are listed, but only abnormal results are displayed) Labs Reviewed  NOVEL CORONAVIRUS, NAA    EKG   Radiology No results found.  Procedures Procedures (including critical care time)  Medications Ordered in UC Medications - No data to display  Initial Impression / Assessment and Plan / UC Course  I have reviewed the triage vital signs and the nursing notes.  Pertinent labs & imaging results that were available during my care of the patient were reviewed by me and considered in my medical decision making (see chart for details).     2.5 weeks of cough  and URI symptoms, treating with doxycycline to cover sinusitis as well  as atypicals in lungs, prednisone course x5 days for bronchitis with albuterol inhaler.  Continue symptomatic and supportive care of cough and congestion.  Discussed strict return precautions. Patient verbalized understanding and is agreeable with plan.  Final Clinical Impressions(s) / UC Diagnoses   Final diagnoses:  Lower respiratory infection (e.g., bronchitis, pneumonia, pneumonitis, pulmonitis)     Discharge Instructions     Begin doxycycline twice daily for 10 days Prednisone daily for 5 days-monitor sugars and be sure to drink plenty of fluids Tessalon for cough Albuterol inhaler as needed for chest tightness shortness of breath and wheezing May continue over-the-counter Robitussin Follow-up if not improving or worsening    ED Prescriptions    Medication Sig Dispense Auth. Provider   doxycycline (VIBRAMYCIN) 100 MG capsule Take 1 capsule (100 mg total) by mouth 2 (two) times daily for 10 days. 20 capsule Lorraine Cimmino C, PA-C   benzonatate (TESSALON) 200 MG capsule Take 1 capsule (200 mg total) by mouth 3 (three) times daily as needed for up to 7 days for cough. 28 capsule Erdem Naas C, PA-C   albuterol (VENTOLIN HFA) 108 (90 Base) MCG/ACT inhaler Inhale 1-2 puffs into the lungs every 6 (six) hours as needed for wheezing or shortness of breath. 18 g Christien Berthelot C, PA-C   predniSONE (DELTASONE) 50 MG tablet Take 1 tablet (50 mg total) by mouth daily with breakfast for 5 days. 5 tablet Izaac Reisig, [place] C, PA-C     PDMP not reviewed this encounter.   Lew Dawes, PA-C 03/18/20 1206

## 2020-03-19 LAB — SARS-COV-2, NAA 2 DAY TAT

## 2020-03-19 LAB — NOVEL CORONAVIRUS, NAA: SARS-CoV-2, NAA: NOT DETECTED

## 2020-10-16 IMAGING — DX DG CHEST 2V
2 series · 2 of 2 positions shown · non-contrast
Comparison: None.

CLINICAL DATA: Tachycardia and SOB. Suspect COVID

EXAM:
CHEST - 2 VIEW

[chest pa]
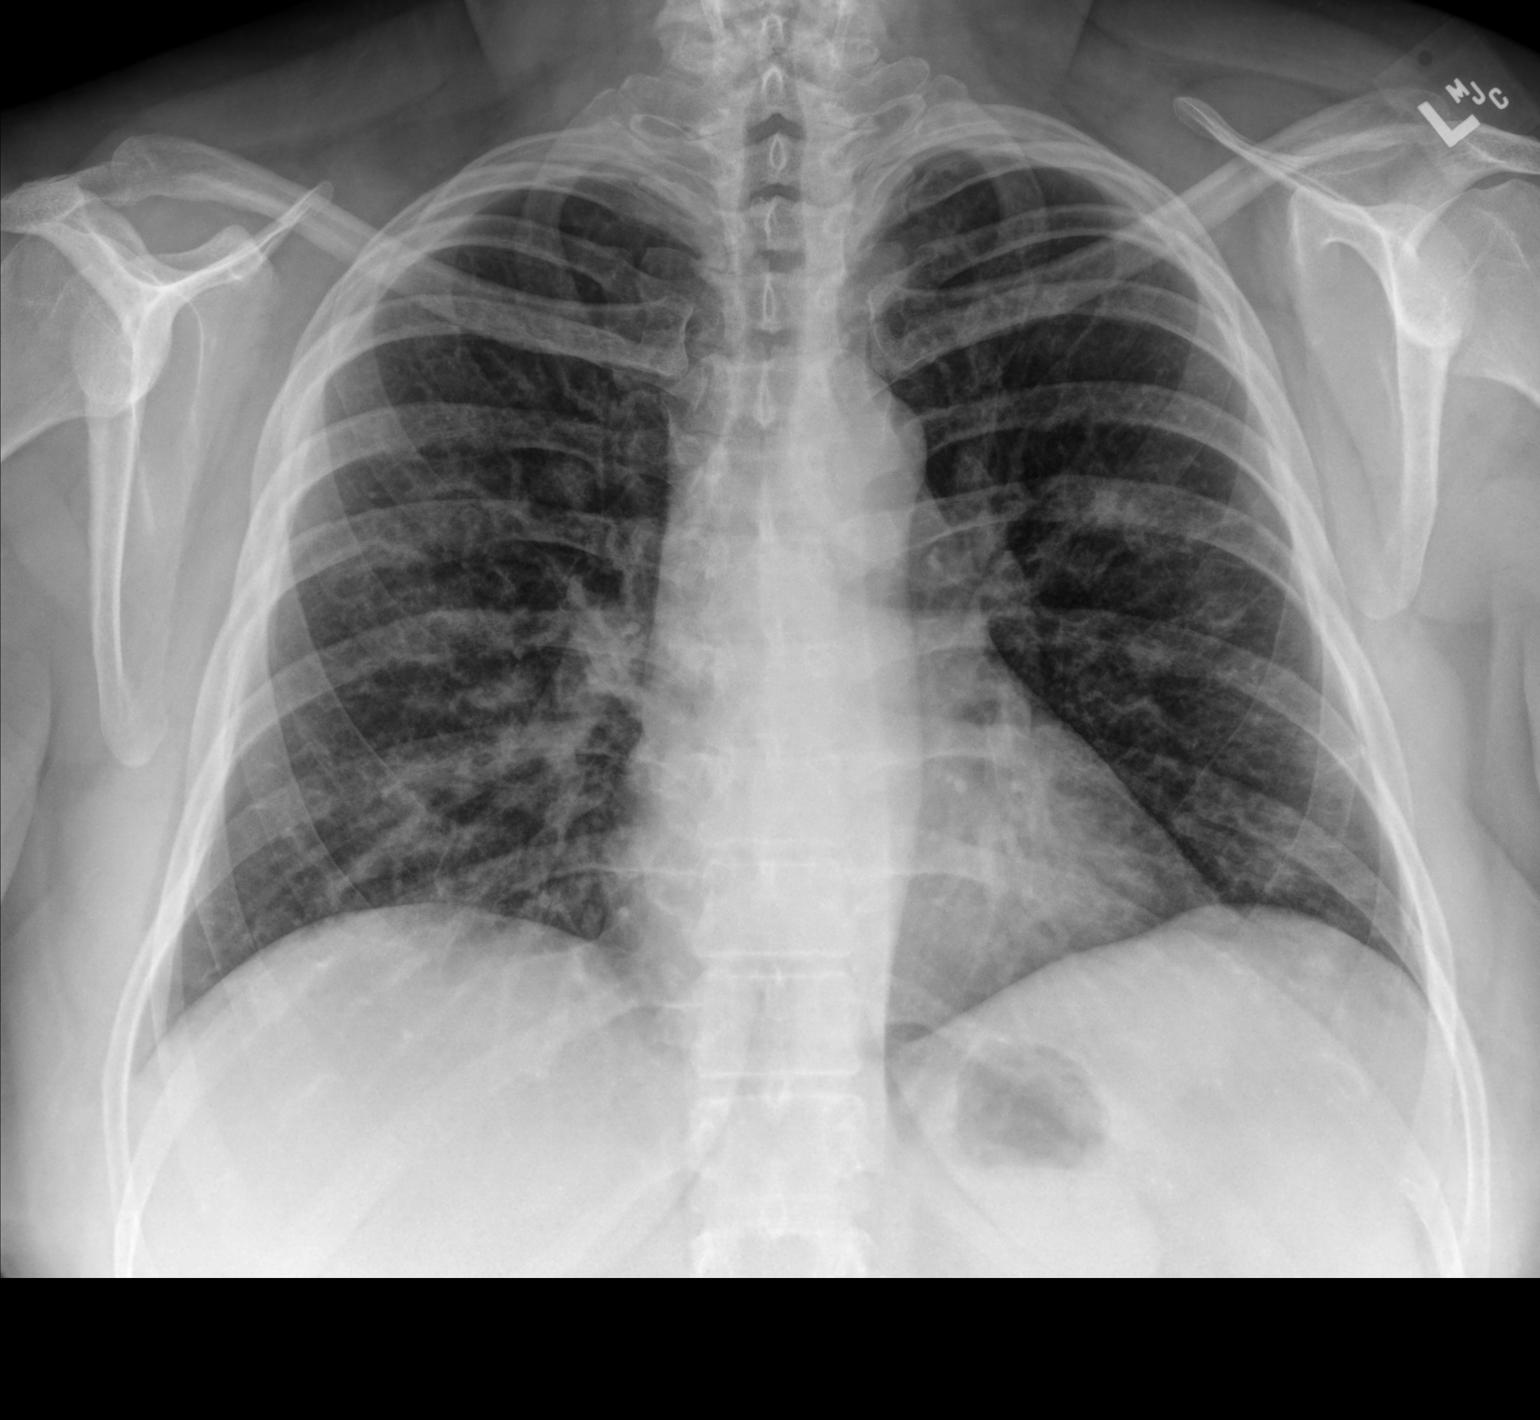

[chest lat]
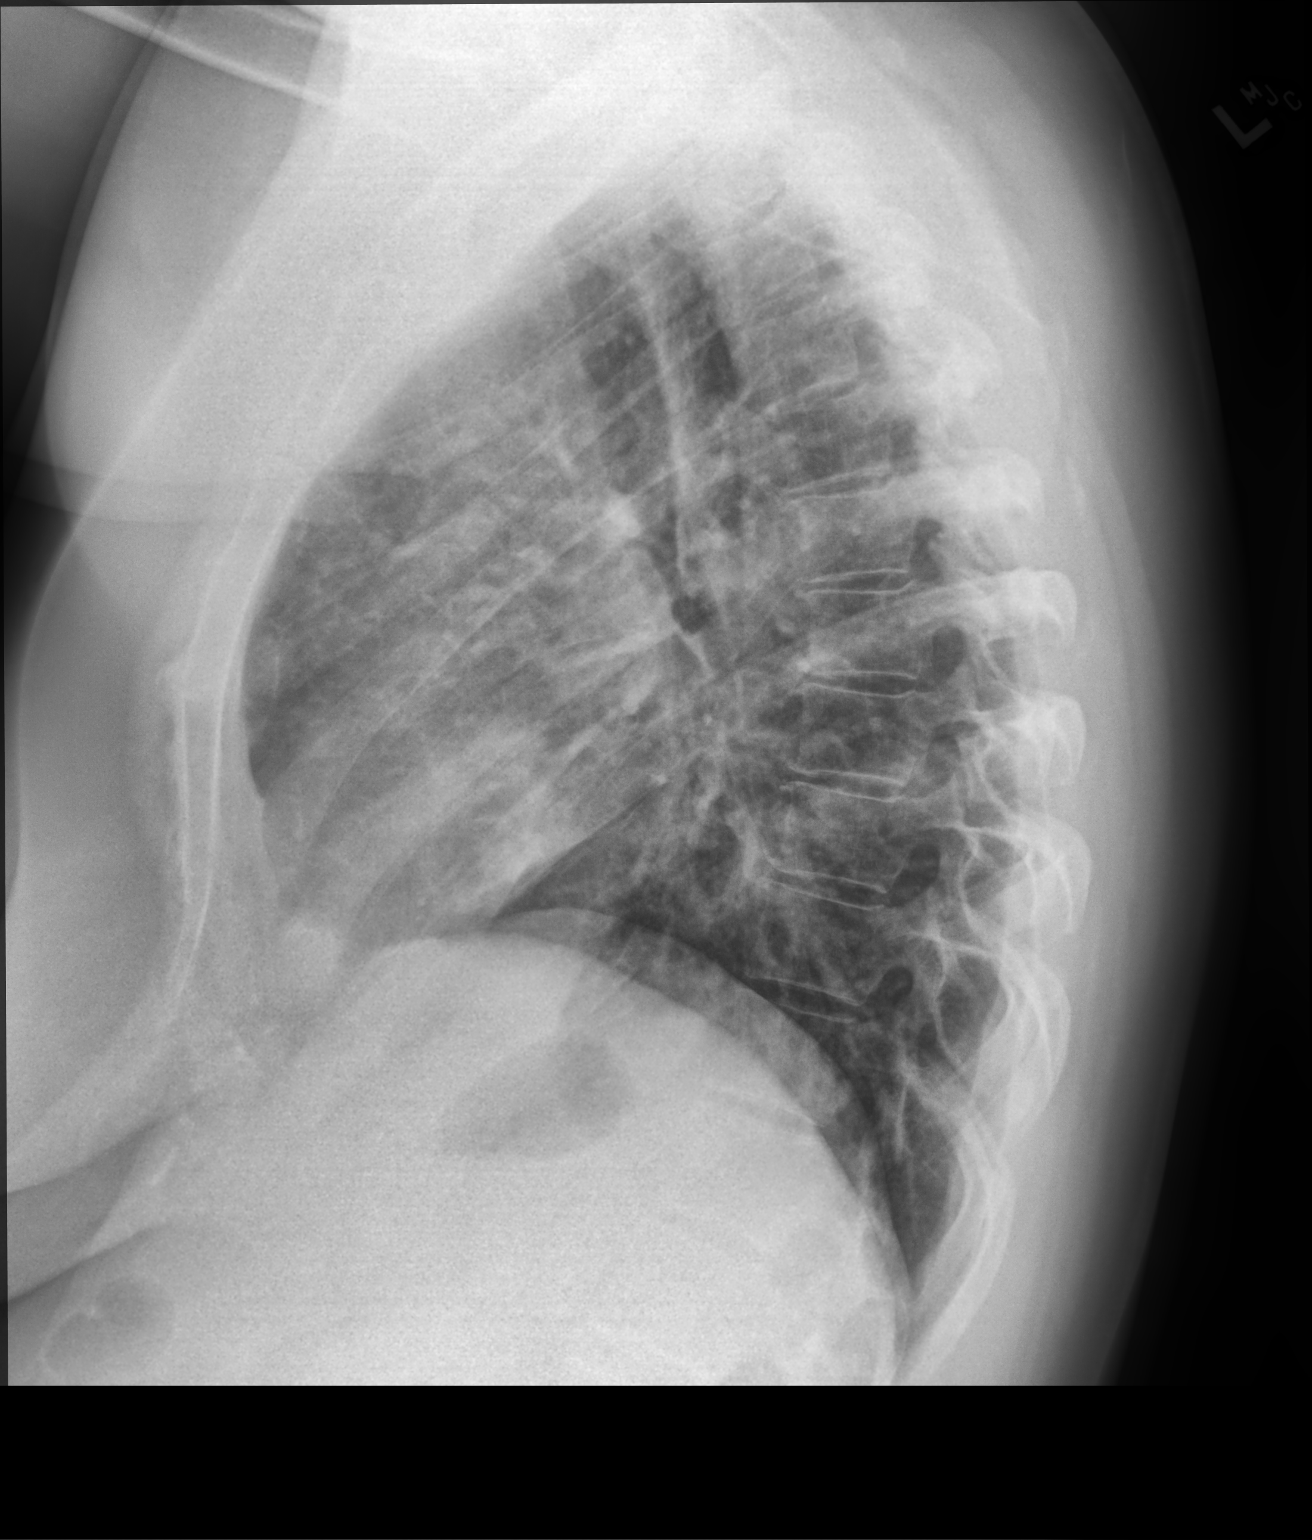

[2 of 2 positions shown; findings below may reference images not displayed]

FINDINGS: Normal heart size. No pleural effusion or edema. There are bilateral
patchy airspace opacities in both lungs concerning for multifocal
pneumonia. Visualized osseous structures are unremarkable.
IMPRESSION: Bilateral patchy airspace opacities concerning for multifocal
pneumonia.

## 2020-11-15 DIAGNOSIS — E1165 Type 2 diabetes mellitus with hyperglycemia: Secondary | ICD-10-CM | POA: Diagnosis not present

## 2020-11-15 DIAGNOSIS — L689 Hypertrichosis, unspecified: Secondary | ICD-10-CM | POA: Diagnosis not present

## 2020-11-15 DIAGNOSIS — I10 Essential (primary) hypertension: Secondary | ICD-10-CM | POA: Diagnosis not present

## 2020-11-15 DIAGNOSIS — E282 Polycystic ovarian syndrome: Secondary | ICD-10-CM | POA: Diagnosis not present

## 2021-06-05 ENCOUNTER — Other Ambulatory Visit: Payer: Self-pay

## 2021-06-05 ENCOUNTER — Ambulatory Visit (HOSPITAL_COMMUNITY)
Admission: RE | Admit: 2021-06-05 | Discharge: 2021-06-05 | Disposition: A | Discharge status: Home / Self Care | Payer: BC Managed Care – PPO | Source: Ambulatory Visit | Attending: Family Medicine | Admitting: Family Medicine

## 2021-06-05 ENCOUNTER — Encounter (HOSPITAL_COMMUNITY): Payer: Self-pay

## 2021-06-05 VITALS — BP 162/107 | HR 113 | Temp 99.3°F | Resp 26

## 2021-06-05 DIAGNOSIS — R062 Wheezing: Secondary | ICD-10-CM

## 2021-06-05 DIAGNOSIS — J069 Acute upper respiratory infection, unspecified: Secondary | ICD-10-CM | POA: Diagnosis not present

## 2021-06-05 MED ORDER — PROMETHAZINE-DM 6.25-15 MG/5ML PO SYRP: 5.0000 mL | ORAL_SOLUTION | Freq: Four times a day (QID) | ORAL | 0 refills | Status: AC | PRN

## 2021-06-05 MED ORDER — PREDNISONE 20 MG PO TABS: 40.0000 mg | ORAL_TABLET | Freq: Every day | ORAL | 0 refills | Status: AC

## 2021-06-05 NOTE — Discharge Instructions (Signed)
Be aware, your blood sugars will be higher than normal while taking prednisone. ?

## 2021-06-05 NOTE — ED Provider Notes (Signed)
?[facility] ? ? ?[id number] ?06/05/21 Arrival Time: [phone] ? ?ASSESSMENT & PLAN: ? ?1. Viral URI with cough   ?2. Wheezing   ? ?Discussed typical duration of viral illnesses. ?No indication for chest imaging. No resp distress. ?OTC symptom care as needed. ? ?Discharge Medication List as of 06/05/2021 10:38 AM  ?  ? ?START taking these medications  ? Details  ?predniSONE (DELTASONE) 20 MG tablet Take 2 tablets (40 mg total) by mouth daily., Starting Wed 06/05/2021, Normal  ?  ?promethazine-dextromethorphan (PROMETHAZINE-DM) 6.25-15 MG/5ML syrup Take 5 mLs by mouth 4 (four) times daily as needed for cough., Starting Wed 06/05/2021, Normal  ?  ?  ? ? ? ?Discharge Instructions   ? ?  ?Be aware, your blood sugars will be higher than normal while taking prednisone. ? ? ? ? ? Follow-up Information   ? ? Dorisann Frames, MD.   ?Specialty: Endocrinology ?Why: As needed. ?Contact information: ?[address] ?[place] [address] ?[phone] ? ? ?  ?  ? ? [facility].   ?Specialty: Urgent Care ?Why: If worsening or failing to improve as anticipated. ?Contact information: ?[address] ?[place] [address] ?[phone] ? ?  ?  ? ?  ?  ? ?  ? ? ?Reviewed expectations re: course of current medical issues. Questions answered. ?Outlined signs and symptoms indicating need for more acute intervention. ?Understanding verbalized. ?After Visit Summary given. ? ? ?SUBJECTIVE: ?History from: Patient. ?Monica Richmond is a 46 y.o. female. Reports: cough and chest congestion; abrupt onset approx 24-48 hours ago; with body aches and fatigued. Ques subj fever/chills. Denies: difficulty breathing but does feel she is wheezing at times; albuterol inhaler with some relief. Normal PO intake without n/v/d. ? ?OBJECTIVE: ? ?Vitals:  ? 06/05/21 1023  ?BP: (!) 162/107  ?Pulse: (!) 113  ?Resp: (!) 26  ?Temp: 99.3 ?F (37.4 ?C)  ?TempSrc: Oral  ?SpO2: 97%  ?  ?Slight tachycardia  noted. Recheck RR: 18. ? ?General appearance: alert; no distress ?Eyes: PERRLA; EOMI; conjunctiva normal ?HENT: [place]; AT; with nasal congestion ?Neck: supple  ?Lungs: speaks full sentences without difficulty; unlabored; CTAB ?Extremities: no edema ?Skin: warm and dry ?Neurologic: normal gait ?Psychological: alert and cooperative; normal mood and affect ? ?No Known Allergies ? ?Past Medical History:  ?Diagnosis Date  ? Diabetes mellitus   ? PCOS (polycystic ovarian syndrome)   ? Prediabetes   ? ?Social History  ? ?Socioeconomic History  ? Marital status: Married  ?  Spouse name: Not on file  ? Number of children: Not on file  ? Years of education: Not on file  ? Highest education level: Not on file  ?Occupational History  ? Not on file  ?Tobacco Use  ? Smoking status: Never  ? Smokeless tobacco: Never  ?Vaping Use  ? Vaping Use: Never used  ?Substance and Sexual Activity  ? Alcohol use: No  ?  Alcohol/week: 0.0 standard drinks  ? Drug use: No  ? Sexual activity: Yes  ?  Partners: Male  ?  Birth control/protection: None  ?Other Topics Concern  ? Not on file  ?Social History Narrative  ? Not on file  ? ?Social Determinants of Health  ? ?Financial Resource Strain: Not on file  ?Food Insecurity: Not on file  ?Transportation Needs: Not on file  ?Physical Activity: Not on file  ?Stress: Not on file  ?Social Connections: Not on file  ?Intimate Partner Violence: Not on file  ? ?  Family History  ?Problem Relation Age of Onset  ? Crohn's disease Daughter 64  ? ?Past Surgical History:  ?Procedure Laterality Date  ? CERVICAL BIOPSY  W/ LOOP ELECTRODE EXCISION  age 21  ? ?  ?Mardella Layman, MD ?06/05/21 1045 ? ?

## 2021-06-05 NOTE — ED Triage Notes (Signed)
Sunday started feeling bad.  Symptoms have worsened since onset.  Cough, hot , cold, fever, chest congestion, aches and pains.  Patient had covid 2 years ago and says her symptoms feel the same ?

## 2021-11-08 DIAGNOSIS — E7800 Pure hypercholesterolemia, unspecified: Secondary | ICD-10-CM | POA: Diagnosis not present

## 2021-11-08 DIAGNOSIS — E1165 Type 2 diabetes mellitus with hyperglycemia: Secondary | ICD-10-CM | POA: Diagnosis not present

## 2021-11-08 DIAGNOSIS — L689 Hypertrichosis, unspecified: Secondary | ICD-10-CM | POA: Diagnosis not present

## 2021-11-15 DIAGNOSIS — E1165 Type 2 diabetes mellitus with hyperglycemia: Secondary | ICD-10-CM | POA: Diagnosis not present

## 2021-11-15 DIAGNOSIS — E282 Polycystic ovarian syndrome: Secondary | ICD-10-CM | POA: Diagnosis not present

## 2021-11-15 DIAGNOSIS — I10 Essential (primary) hypertension: Secondary | ICD-10-CM | POA: Diagnosis not present

## 2021-11-15 DIAGNOSIS — L689 Hypertrichosis, unspecified: Secondary | ICD-10-CM | POA: Diagnosis not present

## 2022-11-07 DIAGNOSIS — E669 Obesity, unspecified: Secondary | ICD-10-CM | POA: Diagnosis not present

## 2022-11-07 DIAGNOSIS — E7800 Pure hypercholesterolemia, unspecified: Secondary | ICD-10-CM | POA: Diagnosis not present

## 2022-11-07 DIAGNOSIS — E282 Polycystic ovarian syndrome: Secondary | ICD-10-CM | POA: Diagnosis not present

## 2022-11-07 DIAGNOSIS — E1165 Type 2 diabetes mellitus with hyperglycemia: Secondary | ICD-10-CM | POA: Diagnosis not present

## 2022-11-14 DIAGNOSIS — E1165 Type 2 diabetes mellitus with hyperglycemia: Secondary | ICD-10-CM | POA: Diagnosis not present

## 2022-11-14 DIAGNOSIS — L689 Hypertrichosis, unspecified: Secondary | ICD-10-CM | POA: Diagnosis not present

## 2022-11-14 DIAGNOSIS — E282 Polycystic ovarian syndrome: Secondary | ICD-10-CM | POA: Diagnosis not present

## 2022-11-14 DIAGNOSIS — I10 Essential (primary) hypertension: Secondary | ICD-10-CM | POA: Diagnosis not present

## 2023-11-13 DIAGNOSIS — E1165 Type 2 diabetes mellitus with hyperglycemia: Secondary | ICD-10-CM | POA: Diagnosis not present

## 2023-11-13 DIAGNOSIS — E282 Polycystic ovarian syndrome: Secondary | ICD-10-CM | POA: Diagnosis not present

## 2023-11-13 DIAGNOSIS — E7800 Pure hypercholesterolemia, unspecified: Secondary | ICD-10-CM | POA: Diagnosis not present

## 2023-11-13 DIAGNOSIS — E669 Obesity, unspecified: Secondary | ICD-10-CM | POA: Diagnosis not present

## 2023-11-20 DIAGNOSIS — L689 Hypertrichosis, unspecified: Secondary | ICD-10-CM | POA: Diagnosis not present

## 2023-11-20 DIAGNOSIS — I10 Essential (primary) hypertension: Secondary | ICD-10-CM | POA: Diagnosis not present

## 2023-11-20 DIAGNOSIS — E1165 Type 2 diabetes mellitus with hyperglycemia: Secondary | ICD-10-CM | POA: Diagnosis not present

## 2023-11-20 DIAGNOSIS — E282 Polycystic ovarian syndrome: Secondary | ICD-10-CM | POA: Diagnosis not present

## 2024-02-25 DIAGNOSIS — E1165 Type 2 diabetes mellitus with hyperglycemia: Secondary | ICD-10-CM | POA: Diagnosis not present
